# Patient Record
Sex: Female | Born: 1940
Health system: Southern US, Community
[De-identification: ages and names within clinical notes are randomized; demographics above are authoritative.]

## PROBLEM LIST (undated history)

## (undated) DIAGNOSIS — Z8719 Personal history of other diseases of the digestive system: Secondary | ICD-10-CM

## (undated) DIAGNOSIS — Z8679 Personal history of other diseases of the circulatory system: Secondary | ICD-10-CM

## (undated) DIAGNOSIS — Z9889 Other specified postprocedural states: Secondary | ICD-10-CM

## (undated) DIAGNOSIS — I10 Essential (primary) hypertension: Secondary | ICD-10-CM

## (undated) DIAGNOSIS — K219 Gastro-esophageal reflux disease without esophagitis: Secondary | ICD-10-CM

## (undated) DIAGNOSIS — K222 Esophageal obstruction: Secondary | ICD-10-CM

## (undated) HISTORY — PX: CHOLECYSTECTOMY: SHX55

## (undated) HISTORY — PX: BREAST LUMPECTOMY: SHX2

## (undated) HISTORY — PX: CATARACT EXTRACTION: SUR2

## (undated) HISTORY — PX: ESOPHAGEAL DILATION: SHX303

---

## 1973-02-19 HISTORY — PX: BACK SURGERY: SHX140

## 1997-06-07 ENCOUNTER — Other Ambulatory Visit: Admission: RE | Admit: 1997-06-07 | Discharge: 1997-06-07 | Payer: Self-pay | Admitting: Obstetrics & Gynecology

## 1998-10-03 ENCOUNTER — Other Ambulatory Visit: Admission: RE | Admit: 1998-10-03 | Discharge: 1998-10-03 | Payer: Self-pay | Admitting: Gynecology

## 1999-12-08 ENCOUNTER — Other Ambulatory Visit: Admission: RE | Admit: 1999-12-08 | Discharge: 1999-12-08 | Payer: Self-pay | Admitting: Gynecology

## 2001-01-10 ENCOUNTER — Other Ambulatory Visit: Admission: RE | Admit: 2001-01-10 | Discharge: 2001-01-10 | Payer: Self-pay | Admitting: Obstetrics and Gynecology

## 2002-02-16 ENCOUNTER — Other Ambulatory Visit: Admission: RE | Admit: 2002-02-16 | Discharge: 2002-02-16 | Payer: Self-pay | Admitting: Obstetrics and Gynecology

## 2003-02-18 ENCOUNTER — Other Ambulatory Visit: Admission: RE | Admit: 2003-02-18 | Discharge: 2003-02-18 | Payer: Self-pay | Admitting: Obstetrics and Gynecology

## 2004-02-25 ENCOUNTER — Other Ambulatory Visit: Admission: RE | Admit: 2004-02-25 | Discharge: 2004-02-25 | Payer: Self-pay | Admitting: Obstetrics and Gynecology

## 2009-08-26 ENCOUNTER — Encounter: Admission: RE | Admit: 2009-08-26 | Discharge: 2009-08-26 | Payer: Self-pay | Admitting: Obstetrics and Gynecology

## 2014-03-10 DIAGNOSIS — Z1231 Encounter for screening mammogram for malignant neoplasm of breast: Secondary | ICD-10-CM | POA: Diagnosis not present

## 2014-04-08 DIAGNOSIS — H01002 Unspecified blepharitis right lower eyelid: Secondary | ICD-10-CM | POA: Diagnosis not present

## 2014-04-08 DIAGNOSIS — H01004 Unspecified blepharitis left upper eyelid: Secondary | ICD-10-CM | POA: Diagnosis not present

## 2014-04-08 DIAGNOSIS — H04123 Dry eye syndrome of bilateral lacrimal glands: Secondary | ICD-10-CM | POA: Diagnosis not present

## 2014-04-08 DIAGNOSIS — H01005 Unspecified blepharitis left lower eyelid: Secondary | ICD-10-CM | POA: Diagnosis not present

## 2014-04-08 DIAGNOSIS — H01001 Unspecified blepharitis right upper eyelid: Secondary | ICD-10-CM | POA: Diagnosis not present

## 2014-04-21 DIAGNOSIS — Z01419 Encounter for gynecological examination (general) (routine) without abnormal findings: Secondary | ICD-10-CM | POA: Diagnosis not present

## 2014-05-10 DIAGNOSIS — M4726 Other spondylosis with radiculopathy, lumbar region: Secondary | ICD-10-CM | POA: Diagnosis not present

## 2014-05-10 DIAGNOSIS — M5416 Radiculopathy, lumbar region: Secondary | ICD-10-CM | POA: Diagnosis not present

## 2014-05-10 DIAGNOSIS — M961 Postlaminectomy syndrome, not elsewhere classified: Secondary | ICD-10-CM | POA: Diagnosis not present

## 2014-06-02 DIAGNOSIS — Z86010 Personal history of colonic polyps: Secondary | ICD-10-CM | POA: Diagnosis not present

## 2014-06-02 DIAGNOSIS — Z1211 Encounter for screening for malignant neoplasm of colon: Secondary | ICD-10-CM | POA: Diagnosis not present

## 2014-06-02 DIAGNOSIS — K5730 Diverticulosis of large intestine without perforation or abscess without bleeding: Secondary | ICD-10-CM | POA: Diagnosis not present

## 2014-06-30 DIAGNOSIS — Z1389 Encounter for screening for other disorder: Secondary | ICD-10-CM | POA: Diagnosis not present

## 2014-06-30 DIAGNOSIS — I10 Essential (primary) hypertension: Secondary | ICD-10-CM | POA: Diagnosis not present

## 2014-06-30 DIAGNOSIS — E2839 Other primary ovarian failure: Secondary | ICD-10-CM | POA: Diagnosis not present

## 2014-06-30 DIAGNOSIS — Z9181 History of falling: Secondary | ICD-10-CM | POA: Diagnosis not present

## 2014-06-30 DIAGNOSIS — R7301 Impaired fasting glucose: Secondary | ICD-10-CM | POA: Diagnosis not present

## 2014-06-30 DIAGNOSIS — Z6823 Body mass index (BMI) 23.0-23.9, adult: Secondary | ICD-10-CM | POA: Diagnosis not present

## 2014-08-13 DIAGNOSIS — M8589 Other specified disorders of bone density and structure, multiple sites: Secondary | ICD-10-CM | POA: Diagnosis not present

## 2014-08-16 DIAGNOSIS — H01002 Unspecified blepharitis right lower eyelid: Secondary | ICD-10-CM | POA: Diagnosis not present

## 2014-08-16 DIAGNOSIS — H01001 Unspecified blepharitis right upper eyelid: Secondary | ICD-10-CM | POA: Diagnosis not present

## 2014-08-16 DIAGNOSIS — H01005 Unspecified blepharitis left lower eyelid: Secondary | ICD-10-CM | POA: Diagnosis not present

## 2014-08-16 DIAGNOSIS — H01004 Unspecified blepharitis left upper eyelid: Secondary | ICD-10-CM | POA: Diagnosis not present

## 2014-08-26 DIAGNOSIS — M961 Postlaminectomy syndrome, not elsewhere classified: Secondary | ICD-10-CM | POA: Diagnosis not present

## 2014-08-26 DIAGNOSIS — M4726 Other spondylosis with radiculopathy, lumbar region: Secondary | ICD-10-CM | POA: Diagnosis not present

## 2014-08-26 DIAGNOSIS — M5416 Radiculopathy, lumbar region: Secondary | ICD-10-CM | POA: Diagnosis not present

## 2014-08-30 DIAGNOSIS — J029 Acute pharyngitis, unspecified: Secondary | ICD-10-CM | POA: Diagnosis not present

## 2014-08-30 DIAGNOSIS — G2581 Restless legs syndrome: Secondary | ICD-10-CM | POA: Diagnosis not present

## 2014-08-30 DIAGNOSIS — J020 Streptococcal pharyngitis: Secondary | ICD-10-CM | POA: Diagnosis not present

## 2014-08-30 DIAGNOSIS — R0982 Postnasal drip: Secondary | ICD-10-CM | POA: Diagnosis not present

## 2014-08-30 DIAGNOSIS — Z6823 Body mass index (BMI) 23.0-23.9, adult: Secondary | ICD-10-CM | POA: Diagnosis not present

## 2014-09-13 DIAGNOSIS — M5416 Radiculopathy, lumbar region: Secondary | ICD-10-CM | POA: Diagnosis not present

## 2014-09-13 DIAGNOSIS — M4726 Other spondylosis with radiculopathy, lumbar region: Secondary | ICD-10-CM | POA: Diagnosis not present

## 2014-09-13 DIAGNOSIS — M961 Postlaminectomy syndrome, not elsewhere classified: Secondary | ICD-10-CM | POA: Diagnosis not present

## 2014-09-30 DIAGNOSIS — M5416 Radiculopathy, lumbar region: Secondary | ICD-10-CM | POA: Diagnosis not present

## 2014-09-30 DIAGNOSIS — M961 Postlaminectomy syndrome, not elsewhere classified: Secondary | ICD-10-CM | POA: Diagnosis not present

## 2014-10-08 ENCOUNTER — Other Ambulatory Visit: Payer: Self-pay | Admitting: Physical Medicine and Rehabilitation

## 2014-10-08 DIAGNOSIS — M545 Low back pain: Secondary | ICD-10-CM

## 2014-10-08 DIAGNOSIS — H04123 Dry eye syndrome of bilateral lacrimal glands: Secondary | ICD-10-CM | POA: Diagnosis not present

## 2014-10-16 ENCOUNTER — Ambulatory Visit
Admission: RE | Admit: 2014-10-16 | Discharge: 2014-10-16 | Disposition: A | Discharge status: Home / Self Care | Payer: Commercial Managed Care - HMO | Source: Ambulatory Visit | Attending: Physical Medicine and Rehabilitation | Admitting: Physical Medicine and Rehabilitation

## 2014-10-16 DIAGNOSIS — M4806 Spinal stenosis, lumbar region: Secondary | ICD-10-CM | POA: Diagnosis not present

## 2014-10-16 DIAGNOSIS — M545 Low back pain: Secondary | ICD-10-CM

## 2014-10-16 DIAGNOSIS — M5126 Other intervertebral disc displacement, lumbar region: Secondary | ICD-10-CM | POA: Diagnosis not present

## 2014-10-21 DIAGNOSIS — M961 Postlaminectomy syndrome, not elsewhere classified: Secondary | ICD-10-CM | POA: Diagnosis not present

## 2014-11-04 DIAGNOSIS — M961 Postlaminectomy syndrome, not elsewhere classified: Secondary | ICD-10-CM | POA: Diagnosis not present

## 2014-11-04 DIAGNOSIS — M5416 Radiculopathy, lumbar region: Secondary | ICD-10-CM | POA: Diagnosis not present

## 2014-11-16 DIAGNOSIS — M961 Postlaminectomy syndrome, not elsewhere classified: Secondary | ICD-10-CM | POA: Diagnosis not present

## 2014-11-16 DIAGNOSIS — M5416 Radiculopathy, lumbar region: Secondary | ICD-10-CM | POA: Diagnosis not present

## 2014-11-16 DIAGNOSIS — M4726 Other spondylosis with radiculopathy, lumbar region: Secondary | ICD-10-CM | POA: Diagnosis not present

## 2014-11-20 DIAGNOSIS — N3090 Cystitis, unspecified without hematuria: Secondary | ICD-10-CM | POA: Diagnosis not present

## 2014-11-22 ENCOUNTER — Other Ambulatory Visit (HOSPITAL_COMMUNITY): Payer: Self-pay | Admitting: Orthopaedic Surgery

## 2014-12-02 ENCOUNTER — Encounter (HOSPITAL_COMMUNITY)
Admission: RE | Admit: 2014-12-02 | Discharge: 2014-12-02 | Disposition: A | Discharge status: Home / Self Care | Payer: Commercial Managed Care - HMO | Source: Ambulatory Visit | Attending: Orthopaedic Surgery | Admitting: Orthopaedic Surgery

## 2014-12-02 ENCOUNTER — Encounter (HOSPITAL_COMMUNITY): Payer: Self-pay

## 2014-12-02 ENCOUNTER — Ambulatory Visit (HOSPITAL_COMMUNITY)
Admission: RE | Admit: 2014-12-02 | Discharge: 2014-12-02 | Disposition: A | Discharge status: Home / Self Care | Payer: Commercial Managed Care - HMO | Source: Ambulatory Visit | Attending: Orthopaedic Surgery | Admitting: Orthopaedic Surgery

## 2014-12-02 DIAGNOSIS — K219 Gastro-esophageal reflux disease without esophagitis: Secondary | ICD-10-CM | POA: Diagnosis not present

## 2014-12-02 DIAGNOSIS — Z01812 Encounter for preprocedural laboratory examination: Secondary | ICD-10-CM | POA: Diagnosis not present

## 2014-12-02 DIAGNOSIS — R9431 Abnormal electrocardiogram [ECG] [EKG]: Secondary | ICD-10-CM | POA: Insufficient documentation

## 2014-12-02 DIAGNOSIS — I447 Left bundle-branch block, unspecified: Secondary | ICD-10-CM | POA: Insufficient documentation

## 2014-12-02 DIAGNOSIS — M4806 Spinal stenosis, lumbar region: Secondary | ICD-10-CM | POA: Diagnosis not present

## 2014-12-02 DIAGNOSIS — Z01818 Encounter for other preprocedural examination: Secondary | ICD-10-CM | POA: Diagnosis not present

## 2014-12-02 DIAGNOSIS — I10 Essential (primary) hypertension: Secondary | ICD-10-CM | POA: Insufficient documentation

## 2014-12-02 DIAGNOSIS — I517 Cardiomegaly: Secondary | ICD-10-CM | POA: Insufficient documentation

## 2014-12-02 DIAGNOSIS — M4800 Spinal stenosis, site unspecified: Secondary | ICD-10-CM | POA: Diagnosis not present

## 2014-12-02 DIAGNOSIS — M48061 Spinal stenosis, lumbar region without neurogenic claudication: Secondary | ICD-10-CM

## 2014-12-02 HISTORY — DX: Other specified postprocedural states: Z98.890

## 2014-12-02 HISTORY — DX: Gastro-esophageal reflux disease without esophagitis: K21.9

## 2014-12-02 HISTORY — DX: Essential (primary) hypertension: I10

## 2014-12-02 HISTORY — DX: Personal history of other diseases of the circulatory system: Z86.79

## 2014-12-02 HISTORY — DX: Esophageal obstruction: K22.2

## 2014-12-02 HISTORY — DX: Personal history of other diseases of the digestive system: Z87.19

## 2014-12-02 LAB — COMPREHENSIVE METABOLIC PANEL
ALBUMIN: 4 g/dL (ref 3.5–5.0)
ALK PHOS: 46 U/L (ref 38–126)
ALT: 15 U/L (ref 14–54)
AST: 20 U/L (ref 15–41)
Anion gap: 7 (ref 5–15)
BUN: 9 mg/dL (ref 6–20)
CALCIUM: 9.4 mg/dL (ref 8.9–10.3)
CHLORIDE: 99 mmol/L — AB (ref 101–111)
CO2: 29 mmol/L (ref 22–32)
CREATININE: 0.83 mg/dL (ref 0.44–1.00)
GFR calc Af Amer: >60 mL/min (ref >60)
GFR calc non Af Amer: >60 mL/min (ref >60)
GLUCOSE: 104 mg/dL — AB (ref 65–99)
Potassium: 3.8 mmol/L (ref 3.5–5.1)
SODIUM: 135 mmol/L (ref 135–145)
Total Bilirubin: 0.9 mg/dL (ref 0.3–1.2)
Total Protein: 6.5 g/dL (ref 6.5–8.1)

## 2014-12-02 LAB — CBC
HCT: 36.8 % (ref 36.0–46.0)
HEMOGLOBIN: 12.4 g/dL (ref 12.0–15.0)
MCH: 29.6 pg (ref 26.0–34.0)
MCHC: 33.7 g/dL (ref 30.0–36.0)
MCV: 87.8 fL (ref 78.0–100.0)
PLATELETS: 312 10*3/uL (ref 150–400)
RBC: 4.19 MIL/uL (ref 3.87–5.11)
RDW: 13.8 % (ref 11.5–15.5)
WBC: 6.8 10*3/uL (ref 4.0–10.5)

## 2014-12-02 LAB — PROTIME-INR
INR: 1.08 (ref 0.00–1.49)
PROTHROMBIN TIME: 14.2 s (ref 11.6–15.2)

## 2014-12-02 LAB — SURGICAL PCR SCREEN
MRSA, PCR: NEGATIVE
STAPHYLOCOCCUS AUREUS: NEGATIVE

## 2014-12-02 MED ORDER — CEFAZOLIN SODIUM-DEXTROSE 2-3 GM-% IV SOLR
2.0000 g | INTRAVENOUS | Status: DC
  Filled 2014-12-02: qty 50

## 2014-12-02 MED ORDER — CHLORHEXIDINE GLUCONATE 4 % EX LIQD: 60.0000 mL | Freq: Once | CUTANEOUS | Status: DC

## 2014-12-02 NOTE — Pre-Procedure Instructions (Signed)
Laura Romero  12/02/2014      Your procedure is scheduled on October 14.  Report to [facility] Admitting at 5:30 A.M.  Call this number if you have problems the morning of surgery:  [phone]   Remember:  Do not eat food or drink liquids after midnight.  Take these medicines the morning of surgery with A SIP OF WATER Atenolol- Chlorthalidone, Omeprazole,   STOP/ Do not take Aspirin, Aleve, Naproxen, Advil, Ibuprofen, Motrin, Vitamins, Herbs, or Supplements starting today   Do not wear jewelry, make-up or nail polish.  Do not wear lotions, powders, or perfumes.  You may wear deodorant.  Do not shave 48 hours prior to surgery.  Men may shave face and neck.  Do not bring valuables to the hospital.  [facility] is not responsible for any belongings or valuables.  Contacts, dentures or bridgework may not be worn into surgery.  Leave your suitcase in the car.  After surgery it may be brought to your room.  For patients admitted to the hospital, discharge time will be determined by your treatment team.  Patients discharged the day of surgery will not be allowed to drive home.   [name] - Preparing for Surgery  Before surgery, you can play an important role.  Because skin is not sterile, your skin needs to be as free of germs as possible.  You can reduce the number of germs on you skin by washing with CHG (chlorahexidine gluconate) soap before surgery.  CHG is an antiseptic cleaner which kills germs and bonds with the skin to continue killing germs even after washing.  Please DO NOT use if you have an allergy to CHG or antibacterial soaps.  If your skin becomes reddened/irritated stop using the CHG and inform your nurse when you arrive at Short Stay.  Do not shave (including legs and underarms) for at least 48 hours prior to the first CHG shower.  You may shave your face.  Please follow these instructions carefully:   1.  Shower with CHG Soap the night before  surgery and the morning of Surgery.  2.  If you choose to wash your hair, wash your hair first as usual with your normal shampoo.  3.  After you shampoo, rinse your hair and body thoroughly to remove the shampoo.  4.  Use CHG as you would any other liquid soap.  You can apply CHG directly to the skin and wash gently with scrungie or a clean washcloth.  5.  Apply the CHG Soap to your body ONLY FROM THE NECK DOWN.  Do not use on open wounds or open sores.  Avoid contact with your eyes, ears, mouth and genitals (private parts).  Wash genitals (private parts) with your normal soap.  6.  Wash thoroughly, paying special attention to the area where your surgery will be performed.  7.  Thoroughly rinse your body with warm water from the neck down.  8.  DO NOT shower/wash with your normal soap after using and rinsing off the CHG Soap.  9.  Pat yourself dry with a clean towel.            10.  Wear clean pajamas.            11.  Place clean sheets on your bed the night of your first shower and do not sleep with pets.  Day of Surgery  Do not apply any lotions the morning of surgery.  Please  wear clean clothes to the hospital/surgery center.   Please read over the following fact sheets that you were given. Pain Booklet, Coughing and Deep Breathing and Surgical Site Infection Prevention

## 2014-12-02 NOTE — Progress Notes (Signed)
Anesthesia Chart Review: Patient is a 74 year old female scheduled for left L4-5 lateral recess decompression, microdiscectomy (left) tomorrow (first case) by Dr. Lorin Mercy.  History includes left BBB, non-smoker, HTN, GERD, esophageal dilation, back surgery '75, cholecystectomy.  PCP is Dr. Nelda Bucks at [facility]. PAT RN reports patient was only a fair historian, but said she had known left BBB and a prior stress test ~ 5 years ago at [facility] No stress test received for from her PCP office, although his note states she does have a known LBBB history.    Meds include Fosamax, ASA 81 mg, Tenoretic, Folvite, Prilosec, tramadol.  12/02/14 EKG: SB with first degree AVB, possible LAE, LAD, left BBB. Old EKG received from her PCP which shows LAD and left BBB present since at least 12/23/13 with handwritten remarks stating, "No change since 09/21/10."  12/02/14 CXR: IMPRESSION: No acute pulmonary disease. Mild cardiomegaly.  Preoperative labs noted.   Above reviewed with anesthesiologist Dr. Jenita Seashore. Patient with known LBBB since at least 2012. If no acute changes then it is anticipated that she can proceed as planned.  George Hugh [facility] Short Stay Center/Anesthesiology Phone [phone] 12/02/2014 3:38 PM

## 2014-12-02 NOTE — Progress Notes (Signed)
Patient reports that she was having trouble recalling her complete medical history. Patient denies chest pain, shortness of breath, cardiology visit. Reports stress test ~ 5 years ago at [facility] due to fast heart rate. Requested EKG and last OV from PCP from Dr. Delena Bali.

## 2014-12-02 NOTE — Progress Notes (Signed)
Spoke to medical records at [facility] and they will fax over requested records.

## 2014-12-02 NOTE — H&P (Signed)
Laura Romero is an 74 y.o. female.   Patient returns for persistent problems with back pain, left leg pain, left leg and dorsal foot numbness, her radicular symptoms.  She has had 8 epidurals over the past 2 years for the same problem.  Patient is followed by Dr. Ilda Romero.     CURRENT MEDICATIONS:  She has been on atenolol, baby aspirin a day, folic acid, and omeprazole.     ALLERGIES  She has no known allergies.     PAST MEDICAL/SURGICAL HISTORY:  Other surgeries only include the back surgery.  She had one child with normal childbirth.     FAMILY HISTORY:  Positive for breast cancer, hypertension.     SOCIAL HISTORY:  She is here with her husband Laura Romero.  Patient is retired.  Does not smoke or drink.     REVIEW OF SYSTEMS:  Positive for hypertension, some teeth and gum disease, previous lumbar surgery by Dr. Verdene Romero in 1975.  Patients last injection which was 11/04/2014 gave her some relief for 3 days and then she has gotten gradual recurrence of her symptoms as before.  First injection of the 8 a couple of years ago lasted several months and gradually her symptoms are getting more severe, more painful with recurrence of left leg symptoms sooner after the injection.       Past Medical History  Diagnosis Date  . Hypertension   . GERD (gastroesophageal reflux disease)   . Esophageal stricture   . Status post dilatation of esophageal stricture   . History of left bundle branch block (LBBB)     Past Surgical History  Procedure Laterality Date  . Back surgery  1975    lower back surgery  . Esophageal dilation    . Breast lumpectomy Left   . Cataract extraction Bilateral   . Cholecystectomy      No family history on file. Social History:  reports that she has never smoked. She has never used smokeless tobacco. She reports that she does not drink alcohol or use illicit drugs.  Allergies: No Known Allergies  No prescriptions prior to admission    Results for orders  placed or performed during the hospital encounter of 12/02/14 (from the past 48 hour(s))  Surgical pcr screen     Status: None   Collection Time: 12/02/14 11:47 AM  Result Value Ref Range   MRSA, PCR NEGATIVE NEGATIVE   Staphylococcus aureus NEGATIVE NEGATIVE    Comment:        The Xpert SA Assay (FDA approved for NASAL specimens in patients over 49 years of age), is one component of a comprehensive surveillance program.  Test performance has been validated by [facility] for patients greater than or equal to 73 year old. It is not intended to diagnose infection nor to guide or monitor treatment.   CBC     Status: None   Collection Time: 12/02/14 11:48 AM  Result Value Ref Range   WBC 6.8 4.0 - 10.5 K/uL   RBC 4.19 3.87 - 5.11 MIL/uL   Hemoglobin 12.4 12.0 - 15.0 g/dL   HCT 36.8 36.0 - 46.0 %   MCV 87.8 78.0 - 100.0 fL   MCH 29.6 26.0 - 34.0 pg   MCHC 33.7 30.0 - 36.0 g/dL   RDW 13.8 11.5 - 15.5 %   Platelets 312 150 - 400 K/uL  Comprehensive metabolic panel     Status: Abnormal   Collection Time: 12/02/14 11:48 AM  Result  Value Ref Range   Sodium 135 135 - 145 mmol/L   Potassium 3.8 3.5 - 5.1 mmol/L   Chloride 99 (L) 101 - 111 mmol/L   CO2 29 22 - 32 mmol/L   Glucose, Bld 104 (H) 65 - 99 mg/dL   BUN 9 6 - 20 mg/dL   Creatinine, Ser 0.83 0.44 - 1.00 mg/dL   Calcium 9.4 8.9 - 10.3 mg/dL   Total Protein 6.5 6.5 - 8.1 g/dL   Albumin 4.0 3.5 - 5.0 g/dL   AST 20 15 - 41 U/L   ALT 15 14 - 54 U/L   Alkaline Phosphatase 46 38 - 126 U/L   Total Bilirubin 0.9 0.3 - 1.2 mg/dL   GFR calc non Af Amer >60 >60 mL/min   GFR calc Af Amer >60 >60 mL/min    Comment: (NOTE) The eGFR has been calculated using the CKD EPI equation. This calculation has not been validated in all clinical situations. eGFR's persistently <60 mL/min signify possible Chronic Kidney Disease.    Anion gap 7 5 - 15  Protime-INR     Status: None   Collection Time: 12/02/14 11:48 AM  Result Value Ref Range    Prothrombin Time 14.2 11.6 - 15.2 seconds   INR 1.08 0.00 - 1.49   Dg Chest 2 View  12/02/2014  CLINICAL DATA:  Spinal stenosis. EXAM: CHEST  2 VIEW COMPARISON:  01/21/2012 . FINDINGS: Mediastinum hilar structures normal. Cardiomegaly. Stable nodular apical pleural parenchymal thickening noted consistent with scarring. No pleural effusion or pneumothorax. No acute bony abnormality . IMPRESSION: No acute pulmonary disease.  Mild cardiomegaly. Electronically Signed   By: Laura Romero  Register   On: 12/02/2014 12:52    Review of Systems  Constitutional: Negative.   HENT: Negative.   Respiratory: Negative.   Cardiovascular: Negative.   Gastrointestinal: Negative.   Genitourinary: Negative.   Musculoskeletal: Positive for back pain.  Skin: Negative.   Psychiatric/Behavioral: Negative.     There were no vitals taken for this visit. Physical Exam  Constitutional: She is oriented to person, place, and time. No distress.  HENT:  Head: Atraumatic.  Eyes: EOM are normal.  Neck: Normal range of motion.  Respiratory: No respiratory distress.  GI: She exhibits no distension.  Musculoskeletal: She exhibits tenderness.  Neurological: She is alert and oriented to person, place, and time.  Skin: Skin is warm and dry.  Psychiatric: She has a normal mood and affect.    PHYSICAL EXAMINATION:  Patient is alert and oriented.  Height:  5 feet 6 inches.  Weight:  143 pounds.  Extraocular movements intact.  No audible wheezing.  Pulse rate is 62 and regular.  No abdominal tenderness.  Pedal pulses are 2+.  Quad strength is good.  Has 1/2 grade anterior tib weakness on the left, negative footdrop gait.  Trace EHL weakness.  Gastrocsoleus is strong.     RADIOGRAPHS:  MRI scan shows lateral recess stenosis with disk protrusion on the left at the L4-5 level.  Dr. Laverta Romero in 1975 did previous surgery on the right at the L4-5 level, which showed no evidence of recurrence.  She has bilateral facet hypertrophy, some  mild central stenosis, but primary problem is left paracentral disk protrusion causing lateral recess stenosis on the left.  She incidentally has a mild lateral protrusion on the right at L3-4, which is not symptomatic.     ASSESSMENT:  Disk protrusion L4-5.     PLAN:  We discussed options.  Patient would  like to proceed with surgical treatment of the disk protrusion, lateral recess decompression.  That would be overnight stay, use of the operative microscopy, using a small portion of her former incision.  Patient's procedure was discussed.  Risk of dural tear, spinal fluid leakage, reoperation, progression of the disk over a number of years, possible need for fusion, possible problems with heart trouble, etc.  She has been active, healthy other than this one specific problem.  Procedure discussed, risks discussed, all questions answered.  She understands and requests to proceed.   OWENS,JAMES M 12/02/2014, 4:35 PM

## 2014-12-03 ENCOUNTER — Encounter (HOSPITAL_COMMUNITY)
Admission: RE | Disposition: A | Discharge status: Home / Self Care | Payer: Self-pay | Source: Ambulatory Visit | Attending: Orthopaedic Surgery

## 2014-12-03 ENCOUNTER — Ambulatory Visit (HOSPITAL_COMMUNITY): Payer: Commercial Managed Care - HMO | Admitting: Vascular Surgery

## 2014-12-03 ENCOUNTER — Ambulatory Visit (HOSPITAL_COMMUNITY): Payer: Commercial Managed Care - HMO

## 2014-12-03 ENCOUNTER — Encounter (HOSPITAL_COMMUNITY): Payer: Self-pay

## 2014-12-03 ENCOUNTER — Ambulatory Visit (HOSPITAL_COMMUNITY): Payer: Commercial Managed Care - HMO | Admitting: Anesthesiology

## 2014-12-03 ENCOUNTER — Observation Stay (HOSPITAL_COMMUNITY)
Admission: RE | Admit: 2014-12-03 | Discharge: 2014-12-04 | Disposition: A | Discharge status: Home / Self Care | Payer: Commercial Managed Care - HMO | Source: Ambulatory Visit | Attending: Orthopaedic Surgery | Admitting: Orthopaedic Surgery

## 2014-12-03 DIAGNOSIS — Z79899 Other long term (current) drug therapy: Secondary | ICD-10-CM | POA: Diagnosis not present

## 2014-12-03 DIAGNOSIS — M5126 Other intervertebral disc displacement, lumbar region: Secondary | ICD-10-CM | POA: Diagnosis not present

## 2014-12-03 DIAGNOSIS — Z419 Encounter for procedure for purposes other than remedying health state, unspecified: Secondary | ICD-10-CM

## 2014-12-03 DIAGNOSIS — M4806 Spinal stenosis, lumbar region: Secondary | ICD-10-CM | POA: Diagnosis not present

## 2014-12-03 DIAGNOSIS — Z9889 Other specified postprocedural states: Secondary | ICD-10-CM | POA: Diagnosis not present

## 2014-12-03 DIAGNOSIS — I10 Essential (primary) hypertension: Secondary | ICD-10-CM | POA: Insufficient documentation

## 2014-12-03 HISTORY — PX: LUMBAR LAMINECTOMY/DECOMPRESSION MICRODISCECTOMY: SHX5026

## 2014-12-03 SURGERY — LUMBAR LAMINECTOMY/DECOMPRESSION MICRODISCECTOMY
Anesthesia: General | Site: Back | Laterality: Left

## 2014-12-03 MED ORDER — CHLORTHALIDONE 25 MG PO TABS
25.0000 mg | ORAL_TABLET | Freq: Every day | ORAL | Status: DC
Start: 2014-12-04 — End: 2014-12-04
  Administered 2014-12-04: 25 mg via ORAL
  Filled 2014-12-03: qty 1

## 2014-12-03 MED ORDER — ROCURONIUM BROMIDE 100 MG/10ML IV SOLN
INTRAVENOUS | Status: DC | PRN
  Administered 2014-12-03: 35 mg via INTRAVENOUS

## 2014-12-03 MED ORDER — LACTATED RINGERS IV SOLN
INTRAVENOUS | Status: DC | PRN
  Administered 2014-12-03 (×2): via INTRAVENOUS

## 2014-12-03 MED ORDER — ATENOLOL-CHLORTHALIDONE 50-25 MG PO TABS: 1.0000 | ORAL_TABLET | Freq: Every day | ORAL | Status: DC

## 2014-12-03 MED ORDER — HYDROMORPHONE HCL 1 MG/ML IJ SOLN
INTRAMUSCULAR | Status: AC
  Filled 2014-12-03: qty 1

## 2014-12-03 MED ORDER — METHOCARBAMOL 500 MG PO TABS: 500.0000 mg | ORAL_TABLET | Freq: Four times a day (QID) | ORAL | Status: DC

## 2014-12-03 MED ORDER — FENTANYL CITRATE (PF) 250 MCG/5ML IJ SOLN
INTRAMUSCULAR | Status: AC
  Filled 2014-12-03: qty 5

## 2014-12-03 MED ORDER — MENTHOL 3 MG MT LOZG
1.0000 | LOZENGE | OROMUCOSAL | Status: DC | PRN
  Filled 2014-12-03: qty 9

## 2014-12-03 MED ORDER — ONDANSETRON HCL 4 MG/2ML IJ SOLN
INTRAMUSCULAR | Status: DC | PRN
  Administered 2014-12-03: 4 mg via INTRAVENOUS

## 2014-12-03 MED ORDER — FOLIC ACID 1 MG PO TABS
1.0000 mg | ORAL_TABLET | Freq: Every day | ORAL | Status: DC
  Administered 2014-12-03 – 2014-12-04 (×2): 1 mg via ORAL
  Filled 2014-12-03 (×2): qty 1

## 2014-12-03 MED ORDER — LIDOCAINE HCL (CARDIAC) 20 MG/ML IV SOLN
INTRAVENOUS | Status: DC | PRN
  Administered 2014-12-03: 60 mg via INTRAVENOUS

## 2014-12-03 MED ORDER — METHOCARBAMOL 500 MG PO TABS: 500.0000 mg | ORAL_TABLET | Freq: Four times a day (QID) | ORAL | Status: DC | PRN

## 2014-12-03 MED ORDER — POLYETHYLENE GLYCOL 3350 17 G PO PACK: 17.0000 g | PACK | Freq: Every day | ORAL | Status: DC | PRN

## 2014-12-03 MED ORDER — PROPOFOL 10 MG/ML IV BOLUS
INTRAVENOUS | Status: AC
  Filled 2014-12-03: qty 20

## 2014-12-03 MED ORDER — PROPOFOL 10 MG/ML IV BOLUS
INTRAVENOUS | Status: DC | PRN
  Administered 2014-12-03: 100 mg via INTRAVENOUS

## 2014-12-03 MED ORDER — 0.9 % SODIUM CHLORIDE (POUR BTL) OPTIME
TOPICAL | Status: DC | PRN
  Administered 2014-12-03: 1000 mL

## 2014-12-03 MED ORDER — ACETAMINOPHEN 325 MG PO TABS: 650.0000 mg | ORAL_TABLET | ORAL | Status: DC | PRN

## 2014-12-03 MED ORDER — FOLIC ACID 800 MCG PO TABS: 800.0000 ug | ORAL_TABLET | Freq: Every day | ORAL | Status: DC

## 2014-12-03 MED ORDER — ONDANSETRON HCL 4 MG/2ML IJ SOLN: 4.0000 mg | INTRAMUSCULAR | Status: DC | PRN

## 2014-12-03 MED ORDER — NEOSTIGMINE METHYLSULFATE 10 MG/10ML IV SOLN
INTRAVENOUS | Status: DC | PRN
Start: 2014-12-03 — End: 2014-12-03
  Administered 2014-12-03: 3 mg via INTRAVENOUS

## 2014-12-03 MED ORDER — SODIUM CHLORIDE 0.9 % IJ SOLN: 3.0000 mL | INTRAMUSCULAR | Status: DC | PRN

## 2014-12-03 MED ORDER — ONDANSETRON HCL 4 MG/2ML IJ SOLN: 4.0000 mg | Freq: Once | INTRAMUSCULAR | Status: DC | PRN

## 2014-12-03 MED ORDER — DEXTROSE 5 % IV SOLN
500.0000 mg | Freq: Four times a day (QID) | INTRAVENOUS | Status: DC | PRN
  Filled 2014-12-03: qty 5

## 2014-12-03 MED ORDER — PHENYLEPHRINE HCL 10 MG/ML IJ SOLN
INTRAMUSCULAR | Status: DC | PRN
  Administered 2014-12-03 (×2): 80 ug via INTRAVENOUS
  Administered 2014-12-03 (×2): 40 ug via INTRAVENOUS

## 2014-12-03 MED ORDER — OXYCODONE-ACETAMINOPHEN 5-325 MG PO TABS: 1.0000 | ORAL_TABLET | Freq: Four times a day (QID) | ORAL | Status: AC | PRN

## 2014-12-03 MED ORDER — OXYCODONE-ACETAMINOPHEN 5-325 MG PO TABS
1.0000 | ORAL_TABLET | ORAL | Status: DC | PRN
  Administered 2014-12-03 – 2014-12-04 (×4): 2 via ORAL
  Filled 2014-12-03 (×4): qty 2

## 2014-12-03 MED ORDER — MIDAZOLAM HCL 2 MG/2ML IJ SOLN
INTRAMUSCULAR | Status: AC
  Filled 2014-12-03: qty 4

## 2014-12-03 MED ORDER — CEFAZOLIN SODIUM 1-5 GM-% IV SOLN
1.0000 g | Freq: Three times a day (TID) | INTRAVENOUS | Status: AC
  Administered 2014-12-03 (×2): 1 g via INTRAVENOUS
  Filled 2014-12-03 (×2): qty 50

## 2014-12-03 MED ORDER — MEPERIDINE HCL 25 MG/ML IJ SOLN: 6.2500 mg | INTRAMUSCULAR | Status: DC | PRN

## 2014-12-03 MED ORDER — POTASSIUM CHLORIDE IN NACL 20-0.45 MEQ/L-% IV SOLN
INTRAVENOUS | Status: DC
  Administered 2014-12-03: 14:00:00 via INTRAVENOUS
  Filled 2014-12-03 (×4): qty 1000

## 2014-12-03 MED ORDER — DEXAMETHASONE SODIUM PHOSPHATE 4 MG/ML IJ SOLN
INTRAMUSCULAR | Status: DC | PRN
  Administered 2014-12-03: 4 mg via INTRAVENOUS

## 2014-12-03 MED ORDER — HYDROMORPHONE HCL 1 MG/ML IJ SOLN: 0.5000 mg | INTRAMUSCULAR | Status: DC | PRN

## 2014-12-03 MED ORDER — SODIUM CHLORIDE 0.9 % IV SOLN: 250.0000 mL | INTRAVENOUS | Status: DC

## 2014-12-03 MED ORDER — PANTOPRAZOLE SODIUM 40 MG PO TBEC
40.0000 mg | DELAYED_RELEASE_TABLET | Freq: Every day | ORAL | Status: DC
  Administered 2014-12-04: 40 mg via ORAL
  Filled 2014-12-03: qty 1

## 2014-12-03 MED ORDER — ATENOLOL 50 MG PO TABS
50.0000 mg | ORAL_TABLET | Freq: Every day | ORAL | Status: DC
  Administered 2014-12-04: 50 mg via ORAL
  Filled 2014-12-03: qty 1

## 2014-12-03 MED ORDER — BUPIVACAINE HCL (PF) 0.25 % IJ SOLN
INTRAMUSCULAR | Status: DC | PRN
  Administered 2014-12-03: 10 mL

## 2014-12-03 MED ORDER — SODIUM CHLORIDE 0.9 % IJ SOLN
3.0000 mL | Freq: Two times a day (BID) | INTRAMUSCULAR | Status: DC
  Administered 2014-12-03 – 2014-12-04 (×2): 3 mL via INTRAVENOUS

## 2014-12-03 MED ORDER — HYDROMORPHONE HCL 1 MG/ML IJ SOLN
0.2500 mg | INTRAMUSCULAR | Status: DC | PRN
  Administered 2014-12-03 (×4): 0.25 mg via INTRAVENOUS

## 2014-12-03 MED ORDER — FENTANYL CITRATE (PF) 100 MCG/2ML IJ SOLN
INTRAMUSCULAR | Status: DC | PRN
  Administered 2014-12-03: 100 ug via INTRAVENOUS
  Administered 2014-12-03: 50 ug via INTRAVENOUS

## 2014-12-03 MED ORDER — BUPIVACAINE HCL (PF) 0.25 % IJ SOLN
INTRAMUSCULAR | Status: AC
  Filled 2014-12-03: qty 30

## 2014-12-03 MED ORDER — THROMBIN 20000 UNITS EX SOLR
CUTANEOUS | Status: AC
  Filled 2014-12-03: qty 20000

## 2014-12-03 MED ORDER — ARTIFICIAL TEARS OP OINT
TOPICAL_OINTMENT | OPHTHALMIC | Status: DC | PRN
  Administered 2014-12-03: 1 via OPHTHALMIC

## 2014-12-03 MED ORDER — GLYCOPYRROLATE 0.2 MG/ML IJ SOLN
INTRAMUSCULAR | Status: DC | PRN
  Administered 2014-12-03: .4 mg via INTRAVENOUS
  Administered 2014-12-03: 0.2 mg via INTRAVENOUS

## 2014-12-03 MED ORDER — DOCUSATE SODIUM 100 MG PO CAPS
100.0000 mg | ORAL_CAPSULE | Freq: Two times a day (BID) | ORAL | Status: DC
  Administered 2014-12-03 – 2014-12-04 (×3): 100 mg via ORAL
  Filled 2014-12-03 (×3): qty 1

## 2014-12-03 MED ORDER — ACETAMINOPHEN 650 MG RE SUPP: 650.0000 mg | RECTAL | Status: DC | PRN

## 2014-12-03 MED ORDER — PHENOL 1.4 % MT LIQD: 1.0000 | OROMUCOSAL | Status: DC | PRN

## 2014-12-03 SURGICAL SUPPLY — 47 items
ADH SKN CLS APL DERMABOND .7 (GAUZE/BANDAGES/DRESSINGS) ×1
BUR ROUND FLUTED 4 SOFT TCH (BURR) ×1 IMPLANT
BUR ROUND FLUTED 4MM SOFT TCH (BURR) ×1
CANISTER SUCT 3000ML PPV (MISCELLANEOUS) ×2 IMPLANT
CLOSURE STERI-STRIP 1/2X4 (GAUZE/BANDAGES/DRESSINGS) ×1
CLSR STERI-STRIP ANTIMIC 1/2X4 (GAUZE/BANDAGES/DRESSINGS) ×2 IMPLANT
COVER SURGICAL LIGHT HANDLE (MISCELLANEOUS) ×3 IMPLANT
DERMABOND ADVANCED (GAUZE/BANDAGES/DRESSINGS) ×2
DERMABOND ADVANCED .7 DNX12 (GAUZE/BANDAGES/DRESSINGS) ×1 IMPLANT
DRAPE MICROSCOPE LEICA (MISCELLANEOUS) ×3 IMPLANT
DRAPE PROXIMA HALF (DRAPES) ×6 IMPLANT
DRSG MEPILEX BORDER 4X4 (GAUZE/BANDAGES/DRESSINGS) ×1 IMPLANT
DRSG MEPILEX BORDER 4X8 (GAUZE/BANDAGES/DRESSINGS) ×3 IMPLANT
DURAPREP 26ML APPLICATOR (WOUND CARE) ×3 IMPLANT
ELECT REM PT RETURN 9FT ADLT (ELECTROSURGICAL) ×3
ELECTRODE REM PT RTRN 9FT ADLT (ELECTROSURGICAL) ×1 IMPLANT
GLOVE BIOGEL PI IND STRL 8 (GLOVE) ×2 IMPLANT
GLOVE BIOGEL PI INDICATOR 8 (GLOVE) ×4
GLOVE ORTHO TXT STRL SZ7.5 (GLOVE) ×6 IMPLANT
GOWN STRL REUS W/ TWL LRG LVL3 (GOWN DISPOSABLE) ×2 IMPLANT
GOWN STRL REUS W/ TWL XL LVL3 (GOWN DISPOSABLE) ×1 IMPLANT
GOWN STRL REUS W/TWL 2XL LVL3 (GOWN DISPOSABLE) ×3 IMPLANT
GOWN STRL REUS W/TWL LRG LVL3 (GOWN DISPOSABLE) ×6
GOWN STRL REUS W/TWL XL LVL3 (GOWN DISPOSABLE) ×3
IV CATH 14GX2 1/4 (CATHETERS) ×2 IMPLANT
KIT BASIN OR (CUSTOM PROCEDURE TRAY) ×3 IMPLANT
KIT ROOM TURNOVER OR (KITS) ×3 IMPLANT
MANIFOLD NEPTUNE II (INSTRUMENTS) IMPLANT
NDL HYPO 25GX1X1/2 BEV (NEEDLE) ×1 IMPLANT
NEEDLE HYPO 25GX1X1/2 BEV (NEEDLE) ×3 IMPLANT
NEEDLE SPNL 18GX3.5 QUINCKE PK (NEEDLE) ×3 IMPLANT
NS IRRIG 1000ML POUR BTL (IV SOLUTION) ×3 IMPLANT
PACK LAMINECTOMY ORTHO (CUSTOM PROCEDURE TRAY) ×3 IMPLANT
PAD ARMBOARD 7.5X6 YLW CONV (MISCELLANEOUS) ×6 IMPLANT
PATTIES SURGICAL .5 X.5 (GAUZE/BANDAGES/DRESSINGS) IMPLANT
PATTIES SURGICAL .75X.75 (GAUZE/BANDAGES/DRESSINGS) ×3 IMPLANT
SUT BONE WAX W31G (SUTURE) ×2 IMPLANT
SUT VIC AB 0 CT1 27 (SUTURE) ×3
SUT VIC AB 0 CT1 27XBRD ANBCTR (SUTURE) ×1 IMPLANT
SUT VIC AB 1 CTX 36 (SUTURE) ×3
SUT VIC AB 1 CTX36XBRD ANBCTR (SUTURE) IMPLANT
SUT VIC AB 2-0 CT1 27 (SUTURE) ×3
SUT VIC AB 2-0 CT1 TAPERPNT 27 (SUTURE) ×1 IMPLANT
SUT VIC AB 3-0 X1 27 (SUTURE) IMPLANT
TOWEL OR 17X24 6PK STRL BLUE (TOWEL DISPOSABLE) ×3 IMPLANT
TOWEL OR 17X26 10 PK STRL BLUE (TOWEL DISPOSABLE) ×3 IMPLANT
WATER STERILE IRR 1000ML POUR (IV SOLUTION) ×1 IMPLANT

## 2014-12-03 NOTE — Anesthesia Procedure Notes (Signed)
Procedure Name: Intubation Date/Time: 12/03/2014 7:44 AM Performed by: Mariea Clonts Pre-anesthesia Checklist: Emergency Drugs available, Patient identified, Timeout performed, Suction available and Patient being monitored Patient Re-evaluated:Patient Re-evaluated prior to inductionOxygen Delivery Method: Circle system utilized Preoxygenation: Pre-oxygenation with 100% oxygen Intubation Type: IV induction Ventilation: Mask ventilation without difficulty and Oral airway inserted - appropriate to patient size Laryngoscope Size: Sabra Heck and 2 Grade View: Grade III Tube type: Oral Tube size: 7.5 mm Number of attempts: 2 Airway Equipment and Method: Oral airway and Bougie stylet Placement Confirmation: ETT inserted through vocal cords under direct vision,  positive ETCO2 and breath sounds checked- equal and bilateral Tube secured with: Tape Dental Injury: Teeth and Oropharynx as per pre-operative assessment

## 2014-12-03 NOTE — Transfer of Care (Signed)
Immediate Anesthesia Transfer of Care Note  Patient: Laura Romero  Procedure(s) Performed: Procedure(s): Left Re-exploration L4-5 Lateral Recess Decompression, Microdiscectomy (Left)  Patient Location: PACU  Anesthesia Type:General  Level of Consciousness: awake, alert  and oriented  Airway & Oxygen Therapy: Patient Spontanous Breathing and Patient connected to nasal cannula oxygen  Post-op Assessment: Report given to RN and Post -op Vital signs reviewed and stable  Post vital signs: Reviewed and stable  Last Vitals:  Filed Vitals:   12/03/14 0619  BP: 192/51  Pulse:   Temp:   Resp:     Complications: No apparent anesthesia complications

## 2014-12-03 NOTE — Brief Op Note (Signed)
12/03/2014  9:36 AM  PATIENT:  Laura Romero  74 y.o. female  PRE-OPERATIVE DIAGNOSIS:  Left L4-5 Disc Protrusion, Lateral Recess Stenosis  POST-OPERATIVE DIAGNOSIS:  Left L4-5 Disc Protrusion, Lateral Recess Stenosis  PROCEDURE:  Procedure(s): Left Re-exploration L4-5 Lateral Recess Decompression, Microdiscectomy (Left)  SURGEON:  Surgeon(s) and Role:    * Marybelle Killings, MD - Primary  PHYSICIAN ASSISTANT: Benjiman Core pa-c     ANESTHESIA:   general  EBL:  Total I/O In: 1000 [I.V.:1000] Out: -   BLOOD ADMINISTERED:none  DRAINS: none   LOCAL MEDICATIONS USED:  MARCAINE     SPECIMEN:  No Specimen  DISPOSITION OF SPECIMEN:  N/A  COUNTS:  YES  TOURNIQUET:  * No tourniquets in log *  DICTATION: .Dragon Dictation  PLAN OF CARE: Admit to inpatient   PATIENT DISPOSITION:  PACU - hemodynamically stable.

## 2014-12-03 NOTE — Anesthesia Postprocedure Evaluation (Signed)
Anesthesia Post Note  Patient: Laura Romero  Procedure(s) Performed: Procedure(s) (LRB): Left Re-exploration L4-5 Lateral Recess Decompression, Microdiscectomy (Left)  Anesthesia type: general  Patient location: PACU  Post pain: Pain level controlled  Post assessment: Patient's Cardiovascular Status Stable  Last Vitals:  Filed Vitals:   12/03/14 1145  BP: 148/52  Pulse: 51  Temp: 36.2 C  Resp: 12    Post vital signs: Reviewed and stable  Level of consciousness: sedated  Complications: No apparent anesthesia complications

## 2014-12-03 NOTE — Progress Notes (Signed)
OOB walked to bathroom and to nsg station.tol well

## 2014-12-03 NOTE — Anesthesia Preprocedure Evaluation (Signed)
Anesthesia Evaluation  Patient identified by MRN, date of birth, ID band Patient awake    Reviewed: Allergy & Precautions, NPO status , Patient's Chart, lab work & pertinent test results  Airway Mallampati: I  TM Distance: >3 FB Neck ROM: Full    Dental   Pulmonary    Pulmonary exam normal       Cardiovascular hypertension, Pt. on medications Normal cardiovascular exam    Neuro/Psych    GI/Hepatic GERD-  Medicated and Controlled,  Endo/Other    Renal/GU      Musculoskeletal   Abdominal   Peds  Hematology   Anesthesia Other Findings   Reproductive/Obstetrics                             Anesthesia Physical Anesthesia Plan  ASA: II  Anesthesia Plan: General   Post-op Pain Management:    Induction: Intravenous  Airway Management Planned: Oral ETT  Additional Equipment:   Intra-op Plan:   Post-operative Plan: Extubation in OR  Informed Consent: I have reviewed the patients History and Physical, chart, labs and discussed the procedure including the risks, benefits and alternatives for the proposed anesthesia with the patient or authorized representative who has indicated his/her understanding and acceptance.     Plan Discussed with: CRNA and Surgeon  Anesthesia Plan Comments:         Anesthesia Quick Evaluation  

## 2014-12-03 NOTE — Interval H&P Note (Signed)
History and Physical Interval Note:  12/03/2014 7:19 AM  Laura Romero  has presented today for surgery, with the diagnosis of Left L4-5 Disc Protrusion, Lateral Recess Stenosis  The various methods of treatment have been discussed with the patient and family. After consideration of risks, benefits and other options for treatment, the patient has consented to  Procedure(s): Left L4-5 Lateral Recess Decompression, Microdiscectomy (Left) as a surgical intervention .  The patient's history has been reviewed, patient examined, no change in status, stable for surgery.  I have reviewed the patient's chart and labs.  Questions were answered to the patient's satisfaction.     Kenetra Hildenbrand C

## 2014-12-04 DIAGNOSIS — Z79899 Other long term (current) drug therapy: Secondary | ICD-10-CM | POA: Diagnosis not present

## 2014-12-04 DIAGNOSIS — I10 Essential (primary) hypertension: Secondary | ICD-10-CM | POA: Diagnosis not present

## 2014-12-04 DIAGNOSIS — M5126 Other intervertebral disc displacement, lumbar region: Secondary | ICD-10-CM | POA: Diagnosis not present

## 2014-12-04 NOTE — Plan of Care (Signed)
Problem: Consults Goal: Diagnosis - Spinal Surgery Microdiscectomy

## 2014-12-04 NOTE — Op Note (Signed)
Laura Romero, Laura Romero NO.:  [id number]  MEDICAL RECORD NO.:  [id number]  LOCATION:  [id number]                        FACILITY:  [facility]  PHYSICIAN:  Rudie Rikard C. Lorin Romero, M.D.    DATE OF BIRTH:  March 13, 1940  DATE OF PROCEDURE:  12/03/2014 DATE OF DISCHARGE:                              OPERATIVE REPORT   PREOPERATIVE DIAGNOSIS:  L4-5 disk protrusion, recurrent with lateral recess compression.  POSTOPERATIVE DIAGNOSIS:  L4-5 disk protrusion, recurrent with lateral recess compression.  PROCEDURE:  Left L4-5 microdiskectomy for recurrent herniated nucleus pulposus, lateral recess decompression.  SURGEON:  Ameilia Rattan C. Lorin Romero, M.D.  ASSISTANT:  Alyson Locket. Ricard Dillon, PA-C, medically necessary and present for the entire procedure.  ESTIMATED BLOOD LOSS:  Minimal.  DRAINS:  None.  COMPLICATIONS:  None.  DESCRIPTION OF PROCEDURE:  After induction of general anesthesia, the patient was placed prone, Ancef was given prophylactically.  Careful padding, chest rolls yellow pads underneath the forearm, and rolled underneath the shoulders.  Calf bumpers were applied.  Back was prepped with DuraPrep.  The area was squared with towels, Betadine, Steri-Drape applied after the old incision had been outlined at L4-5 level from where the patient had surgery approximately 30 years ago.  Needle localization, cross-table lateral x-ray showed that the needle was above the L4-5 interspace.  Old incision was opened.  Subperiosteal dissection, Taylor retractor placed laterally.  There was extensive scar tissue present over the lamina and partial resection of the lamina. Scar tissue had to be removed and starting at the facet, the bony edge of the inferior aspect of the remaining lamina was followed.  Once the lamina was adequately visualized, a Penfield 4 was placed.  X-ray was taken, which confirmed it was overlying the disk space.  Lamina remaining portion was thinned with a bur leaving the top half  of the lamina and then with the operative draped microscope brought in, bone was removed following the lateral gutter out to the level of the pedicle.  Nerve root was identified at its shoulder.  Thick chunks of ligament were removed, which was causing lateral recess stenosis, and there was prominent disk protrusion with some cephalad and caudad disk underneath the ligament causing dorsal displacement of the nerve root. With the D'Errico protecting the midline angled in line at the nerve root, annulus was incised with a 15 blade.  Chunks of disk were removed. Micropituitary and Epstein curettes were used.  Continued removal of disk until the disk was decompressed.  Multiple chunks of disk were removed, which allowed the disk to be flat and not cause compression. Some further bone was removed.  Bone taken off the top of the nerve root as it entered the foramina.  Palpation of the hockey-stick inferior to the edge of the pedicle and across the midline was performed with good decompression.  Nerve root was free, some remaining small pieces of retained ligament were removed.  Bipolar cautery was used.  Operative field was dry.  Final passes were made in the disk to make sure there were no remaining chunks.  There was good decompression of the lateral recess.  Nerve root was free, was visualized as it separated from the midline structures exited  out underneath the pedicle with no areas of compression and the axillary nerve root was free.  Irrigation with saline solution.  Standard layered closure with #1 Vicryl on the fascia, 2-0 Vicryl subcutaneous tissue, subcuticular closure.  Dermabond on skin, postop dressing, and transferred to the recovery room.  Instrument count and needle count was correct.     Laura Romero, M.D.     MCY/MEDQ  D:  12/03/2014  T:  12/04/2014  Job:  [id number]

## 2014-12-04 NOTE — Progress Notes (Signed)
Subjective: 1 Day Post-Op Procedure(s) (LRB): Left Re-exploration L4-5 Lateral Recess Decompression, Microdiscectomy (Left) Patient reports pain as moderate.  Doing really well though; up in her room ambulating easily and dressed. Objective: Vital signs in last 24 hours: Temp:  [97.1 F (36.2 C)-98.4 F (36.9 C)] 98.4 F (36.9 C) (10/15 0427) Pulse Rate:  [51-65] 65 (10/15 0427) Resp:  [12-18] 18 (10/15 0427) BP: (146-161)/(49-64) 147/49 mmHg (10/15 0427) SpO2:  [98 %-100 %] 99 % (10/15 0427)  Intake/Output from previous day: 10/14 0701 - 10/15 0700 In: 1740 [P.O.:240; I.V.:1500] Out: 40 [Blood:40] Intake/Output this shift:     Recent Labs  12/02/14 1148  HGB 12.4    Recent Labs  12/02/14 1148  WBC 6.8  RBC 4.19  HCT 36.8  PLT 312    Recent Labs  12/02/14 1148  NA 135  K 3.8  CL 99*  CO2 29  BUN 9  CREATININE 0.83  GLUCOSE 104*  CALCIUM 9.4    Recent Labs  12/02/14 1148  INR 1.08    Sensation intact distally Intact pulses distally Dorsiflexion/Plantar flexion intact Incision: dressing C/D/I  Assessment/Plan: 1 Day Post-Op Procedure(s) (LRB): Left Re-exploration L4-5 Lateral Recess Decompression, Microdiscectomy (Left) Discharge to home today  Aleksei Goodlin Y 12/04/2014, 11:28 AM

## 2014-12-04 NOTE — Discharge Summary (Signed)
Patient ID: Laura Romero MRN: [id number] DOB/AGE: January 24, 1941 74 y.o.  Admit date: 12/03/2014 Discharge date: 12/04/2014  Admission Diagnoses:  Active Problems:   S/P lumbar discectomy   Discharge Diagnoses:  Same  Past Medical History  Diagnosis Date  . Hypertension   . GERD (gastroesophageal reflux disease)   . Esophageal stricture   . Status post dilatation of esophageal stricture   . History of left bundle branch block (LBBB)     Surgeries: Procedure(s): Left Re-exploration L4-5 Lateral Recess Decompression, Microdiscectomy on 12/03/2014   Consultants:    Discharged Condition: Improved  Hospital Course: Laura Romero is an 74 y.o. female who was admitted 12/03/2014 for operative treatment of<principal problem not specified>. Patient has severe unremitting pain that affects sleep, daily activities, and work/hobbies. After pre-op clearance the patient was taken to the operating room on 12/03/2014 and underwent  Procedure(s): Left Re-exploration L4-5 Lateral Recess Decompression, Microdiscectomy.    Patient was given perioperative antibiotics: Anti-infectives    Start     Dose/Rate Route Frequency Ordered Stop   12/03/14 1200  ceFAZolin (ANCEF) IVPB 1 g/50 mL premix     1 g 100 mL/hr over 30 Minutes Intravenous Every 8 hours 12/03/14 1122 12/03/14 2029   12/03/14 0700  ceFAZolin (ANCEF) IVPB 2 g/50 mL premix  Status:  Discontinued     2 g 100 mL/hr over 30 Minutes Intravenous To ShortStay Surgical 12/02/14 1415 12/03/14 1107       Patient was given sequential compression devices, early ambulation, and chemoprophylaxis to prevent DVT.  Patient benefited maximally from hospital stay and there were no complications.    Recent vital signs: Patient Vitals for the past 24 hrs:  BP Temp Temp src Pulse Resp SpO2  12/04/14 0427 (!) 147/49 mmHg 98.4 F (36.9 C) Oral 65 18 99 %  12/04/14 0011 (!) 146/56 mmHg 98.3 F (36.8 C) Oral 61 18 98 %  12/03/14 2018 (!)  161/64 mmHg 97.6 F (36.4 C) Oral 64 16 100 %  12/03/14 1145 (!) 148/52 mmHg 97.1 F (36.2 C) Oral (!) 51 12 100 %     Recent laboratory studies:  Recent Labs  12/02/14 1148  WBC 6.8  HGB 12.4  HCT 36.8  PLT 312  NA 135  K 3.8  CL 99*  CO2 29  BUN 9  CREATININE 0.83  GLUCOSE 104*  INR 1.08  CALCIUM 9.4     Discharge Medications:     Medication List    STOP taking these medications        acetaminophen 500 MG tablet  Commonly known as:  TYLENOL     aspirin 81 MG tablet     traMADol 50 MG tablet  Commonly known as:  ULTRAM      TAKE these medications        alendronate 70 MG tablet  Commonly known as:  FOSAMAX  Take 70 mg by mouth once a week. Take with a full glass of water on an empty stomach.     atenolol-chlorthalidone 50-25 MG tablet  Commonly known as:  TENORETIC  Take 1 tablet by mouth daily.     CALCIUM + D PO  Take 1 tablet by mouth daily.     folic acid 350 MCG tablet  Commonly known as:  FOLVITE  Take 800 mcg by mouth daily.     methocarbamol 500 MG tablet  Commonly known as:  ROBAXIN  Take 1 tablet (500 mg total) by mouth 4 (four) times daily.  omeprazole 20 MG capsule  Commonly known as:  PRILOSEC  Take 20 mg by mouth daily.     oxyCODONE-acetaminophen 5-325 MG tablet  Commonly known as:  ROXICET  Take 1 tablet by mouth every 6 (six) hours as needed for severe pain.        Diagnostic Studies: Dg Chest 2 View  12/02/2014  CLINICAL DATA:  Spinal stenosis. EXAM: CHEST  2 VIEW COMPARISON:  01/21/2012 . FINDINGS: Mediastinum hilar structures normal. Cardiomegaly. Stable nodular apical pleural parenchymal thickening noted consistent with scarring. No pleural effusion or pneumothorax. No acute bony abnormality . IMPRESSION: No acute pulmonary disease.  Mild cardiomegaly. Electronically Signed   By: Marcello Moores  Register   On: 12/02/2014 12:52   Dg Lumbar Spine 2-3 Views  12/03/2014  CLINICAL DATA:  Disc surgery. EXAM: LUMBAR SPINE - 2-3  VIEW COMPARISON:  MRI 10/16/2014. FINDINGS: Lumbar vertebra numbered as per prior MRI. Metallic marker noted posteriorly at the level of L4. Multilevel degenerative change. No acute abnormality. IMPRESSION: Metallic marker noted posteriorly at the level of L4. Electronically Signed   By: Marcello Moores  Register   On: 12/03/2014 09:15    Disposition: to home      Discharge Instructions    Call MD / Call 911    Complete by:  As directed   If you experience chest pain or shortness of breath, CALL 911 and be transported to the hospital emergency room.  If you develope a fever above 101 F, pus (white drainage) or increased drainage or redness at the wound, or calf pain, call your surgeon's office.     Constipation Prevention    Complete by:  As directed   Drink plenty of fluids.  Prune juice may be helpful.  You may use a stool softener, such as Colace (over the counter) 100 mg twice a day.  Use MiraLax (over the counter) for constipation as needed.     Diet - low sodium heart healthy    Complete by:  As directed      Discharge instructions    Complete by:  As directed   Ok to shower 5 days postop.  Do not apply any creams or ointments to incision.  Do not remove steri-strips.  Can use 4x4 gauze and tape for dressing changes.  No aggressive activity.  No bending, squatting or prolonged sitting.  Mostly be in reclined position or lying down.     Discharge patient    Complete by:  As directed      Driving restrictions    Complete by:  As directed   No driving     Increase activity slowly as tolerated    Complete by:  As directed      Lifting restrictions    Complete by:  As directed   No lifting           Follow-up Information    Schedule an appointment as soon as possible for a visit with Marybelle Killings, MD.   Specialty:  Orthopedic Surgery   Why:  need return office visit one week postop   Contact information:   [place] [address] [phone]         Signed: Mcarthur Rossetti 12/04/2014, 11:30 AM

## 2014-12-06 ENCOUNTER — Encounter (HOSPITAL_COMMUNITY): Payer: Self-pay | Admitting: Orthopaedic Surgery

## 2014-12-07 DIAGNOSIS — M5416 Radiculopathy, lumbar region: Secondary | ICD-10-CM | POA: Diagnosis not present

## 2014-12-13 DIAGNOSIS — H01005 Unspecified blepharitis left lower eyelid: Secondary | ICD-10-CM | POA: Diagnosis not present

## 2014-12-13 DIAGNOSIS — H01004 Unspecified blepharitis left upper eyelid: Secondary | ICD-10-CM | POA: Diagnosis not present

## 2014-12-13 DIAGNOSIS — H01001 Unspecified blepharitis right upper eyelid: Secondary | ICD-10-CM | POA: Diagnosis not present

## 2014-12-13 DIAGNOSIS — H01002 Unspecified blepharitis right lower eyelid: Secondary | ICD-10-CM | POA: Diagnosis not present

## 2014-12-17 DIAGNOSIS — Z6823 Body mass index (BMI) 23.0-23.9, adult: Secondary | ICD-10-CM | POA: Diagnosis not present

## 2014-12-17 DIAGNOSIS — R399 Unspecified symptoms and signs involving the genitourinary system: Secondary | ICD-10-CM | POA: Diagnosis not present

## 2015-01-10 DIAGNOSIS — M8580 Other specified disorders of bone density and structure, unspecified site: Secondary | ICD-10-CM | POA: Diagnosis not present

## 2015-01-10 DIAGNOSIS — I10 Essential (primary) hypertension: Secondary | ICD-10-CM | POA: Diagnosis not present

## 2015-01-10 DIAGNOSIS — R7301 Impaired fasting glucose: Secondary | ICD-10-CM | POA: Diagnosis not present

## 2015-01-10 DIAGNOSIS — Z23 Encounter for immunization: Secondary | ICD-10-CM | POA: Diagnosis not present

## 2015-03-17 DIAGNOSIS — Z1231 Encounter for screening mammogram for malignant neoplasm of breast: Secondary | ICD-10-CM | POA: Diagnosis not present

## 2015-03-22 DIAGNOSIS — L821 Other seborrheic keratosis: Secondary | ICD-10-CM | POA: Diagnosis not present

## 2015-03-22 DIAGNOSIS — C44311 Basal cell carcinoma of skin of nose: Secondary | ICD-10-CM | POA: Diagnosis not present

## 2015-05-09 DIAGNOSIS — Z6824 Body mass index (BMI) 24.0-24.9, adult: Secondary | ICD-10-CM | POA: Diagnosis not present

## 2015-05-09 DIAGNOSIS — M65311 Trigger thumb, right thumb: Secondary | ICD-10-CM | POA: Diagnosis not present

## 2015-05-18 DIAGNOSIS — M65311 Trigger thumb, right thumb: Secondary | ICD-10-CM | POA: Diagnosis not present

## 2015-05-18 DIAGNOSIS — M19041 Primary osteoarthritis, right hand: Secondary | ICD-10-CM | POA: Diagnosis not present

## 2015-06-14 DIAGNOSIS — I447 Left bundle-branch block, unspecified: Secondary | ICD-10-CM | POA: Diagnosis not present

## 2015-06-14 DIAGNOSIS — Z6823 Body mass index (BMI) 23.0-23.9, adult: Secondary | ICD-10-CM | POA: Diagnosis not present

## 2015-06-14 DIAGNOSIS — I10 Essential (primary) hypertension: Secondary | ICD-10-CM | POA: Diagnosis not present

## 2015-06-14 DIAGNOSIS — M65311 Trigger thumb, right thumb: Secondary | ICD-10-CM | POA: Diagnosis not present

## 2015-06-20 DIAGNOSIS — M65311 Trigger thumb, right thumb: Secondary | ICD-10-CM | POA: Diagnosis not present

## 2015-06-23 DIAGNOSIS — Z79899 Other long term (current) drug therapy: Secondary | ICD-10-CM | POA: Diagnosis not present

## 2015-06-23 DIAGNOSIS — K219 Gastro-esophageal reflux disease without esophagitis: Secondary | ICD-10-CM | POA: Diagnosis not present

## 2015-06-23 DIAGNOSIS — I10 Essential (primary) hypertension: Secondary | ICD-10-CM | POA: Diagnosis not present

## 2015-06-23 DIAGNOSIS — M65311 Trigger thumb, right thumb: Secondary | ICD-10-CM | POA: Diagnosis not present

## 2015-06-23 DIAGNOSIS — I447 Left bundle-branch block, unspecified: Secondary | ICD-10-CM | POA: Diagnosis not present

## 2015-06-23 DIAGNOSIS — G2581 Restless legs syndrome: Secondary | ICD-10-CM | POA: Diagnosis not present

## 2015-06-27 DIAGNOSIS — M6281 Muscle weakness (generalized): Secondary | ICD-10-CM | POA: Diagnosis not present

## 2015-06-27 DIAGNOSIS — M25641 Stiffness of right hand, not elsewhere classified: Secondary | ICD-10-CM | POA: Diagnosis not present

## 2015-06-27 DIAGNOSIS — M65311 Trigger thumb, right thumb: Secondary | ICD-10-CM | POA: Diagnosis not present

## 2015-06-27 DIAGNOSIS — M25541 Pain in joints of right hand: Secondary | ICD-10-CM | POA: Diagnosis not present

## 2015-07-01 DIAGNOSIS — M25541 Pain in joints of right hand: Secondary | ICD-10-CM | POA: Diagnosis not present

## 2015-07-01 DIAGNOSIS — M6281 Muscle weakness (generalized): Secondary | ICD-10-CM | POA: Diagnosis not present

## 2015-07-01 DIAGNOSIS — M65311 Trigger thumb, right thumb: Secondary | ICD-10-CM | POA: Diagnosis not present

## 2015-07-01 DIAGNOSIS — M25641 Stiffness of right hand, not elsewhere classified: Secondary | ICD-10-CM | POA: Diagnosis not present

## 2015-07-11 DIAGNOSIS — H01001 Unspecified blepharitis right upper eyelid: Secondary | ICD-10-CM | POA: Diagnosis not present

## 2015-07-11 DIAGNOSIS — H01005 Unspecified blepharitis left lower eyelid: Secondary | ICD-10-CM | POA: Diagnosis not present

## 2015-07-11 DIAGNOSIS — H01004 Unspecified blepharitis left upper eyelid: Secondary | ICD-10-CM | POA: Diagnosis not present

## 2015-07-11 DIAGNOSIS — H01002 Unspecified blepharitis right lower eyelid: Secondary | ICD-10-CM | POA: Diagnosis not present

## 2015-07-26 DIAGNOSIS — H524 Presbyopia: Secondary | ICD-10-CM | POA: Diagnosis not present

## 2015-07-26 DIAGNOSIS — H5210 Myopia, unspecified eye: Secondary | ICD-10-CM | POA: Diagnosis not present

## 2015-09-07 DIAGNOSIS — S46911A Strain of unspecified muscle, fascia and tendon at shoulder and upper arm level, right arm, initial encounter: Secondary | ICD-10-CM | POA: Diagnosis not present

## 2015-09-13 DIAGNOSIS — Z6824 Body mass index (BMI) 24.0-24.9, adult: Secondary | ICD-10-CM | POA: Diagnosis not present

## 2015-09-13 DIAGNOSIS — M19011 Primary osteoarthritis, right shoulder: Secondary | ICD-10-CM | POA: Diagnosis not present

## 2015-09-13 DIAGNOSIS — M25511 Pain in right shoulder: Secondary | ICD-10-CM | POA: Diagnosis not present

## 2015-09-20 DIAGNOSIS — M7541 Impingement syndrome of right shoulder: Secondary | ICD-10-CM | POA: Diagnosis not present

## 2015-09-20 DIAGNOSIS — M25511 Pain in right shoulder: Secondary | ICD-10-CM | POA: Diagnosis not present

## 2015-09-21 DIAGNOSIS — M7541 Impingement syndrome of right shoulder: Secondary | ICD-10-CM | POA: Diagnosis not present

## 2015-09-21 DIAGNOSIS — M25511 Pain in right shoulder: Secondary | ICD-10-CM | POA: Diagnosis not present

## 2015-09-21 DIAGNOSIS — M25611 Stiffness of right shoulder, not elsewhere classified: Secondary | ICD-10-CM | POA: Diagnosis not present

## 2015-10-03 DIAGNOSIS — M25611 Stiffness of right shoulder, not elsewhere classified: Secondary | ICD-10-CM | POA: Diagnosis not present

## 2015-10-03 DIAGNOSIS — M7541 Impingement syndrome of right shoulder: Secondary | ICD-10-CM | POA: Diagnosis not present

## 2015-10-03 DIAGNOSIS — M25511 Pain in right shoulder: Secondary | ICD-10-CM | POA: Diagnosis not present

## 2015-10-11 DIAGNOSIS — M7541 Impingement syndrome of right shoulder: Secondary | ICD-10-CM | POA: Diagnosis not present

## 2015-10-11 DIAGNOSIS — M25511 Pain in right shoulder: Secondary | ICD-10-CM | POA: Diagnosis not present

## 2015-10-11 DIAGNOSIS — M25611 Stiffness of right shoulder, not elsewhere classified: Secondary | ICD-10-CM | POA: Diagnosis not present

## 2015-10-13 DIAGNOSIS — K219 Gastro-esophageal reflux disease without esophagitis: Secondary | ICD-10-CM | POA: Diagnosis not present

## 2015-10-13 DIAGNOSIS — Z6823 Body mass index (BMI) 23.0-23.9, adult: Secondary | ICD-10-CM | POA: Diagnosis not present

## 2015-10-13 DIAGNOSIS — I10 Essential (primary) hypertension: Secondary | ICD-10-CM | POA: Diagnosis not present

## 2015-10-13 DIAGNOSIS — R7301 Impaired fasting glucose: Secondary | ICD-10-CM | POA: Diagnosis not present

## 2015-10-13 DIAGNOSIS — M8580 Other specified disorders of bone density and structure, unspecified site: Secondary | ICD-10-CM | POA: Diagnosis not present

## 2015-10-13 DIAGNOSIS — Z23 Encounter for immunization: Secondary | ICD-10-CM | POA: Diagnosis not present

## 2015-10-19 DIAGNOSIS — M7541 Impingement syndrome of right shoulder: Secondary | ICD-10-CM | POA: Diagnosis not present

## 2015-11-04 DIAGNOSIS — S52501A Unspecified fracture of the lower end of right radius, initial encounter for closed fracture: Secondary | ICD-10-CM | POA: Diagnosis not present

## 2015-11-08 DIAGNOSIS — S52501A Unspecified fracture of the lower end of right radius, initial encounter for closed fracture: Secondary | ICD-10-CM | POA: Diagnosis not present

## 2015-11-16 IMAGING — CR DG LUMBAR SPINE 2-3V
2 series · 2 of 2 positions shown · non-contrast
Comparison: MRI 10/16/2014.

CLINICAL DATA: Disc surgery.

EXAM:
LUMBAR SPINE - 2-3 VIEW

[lateral (1 of 2)]
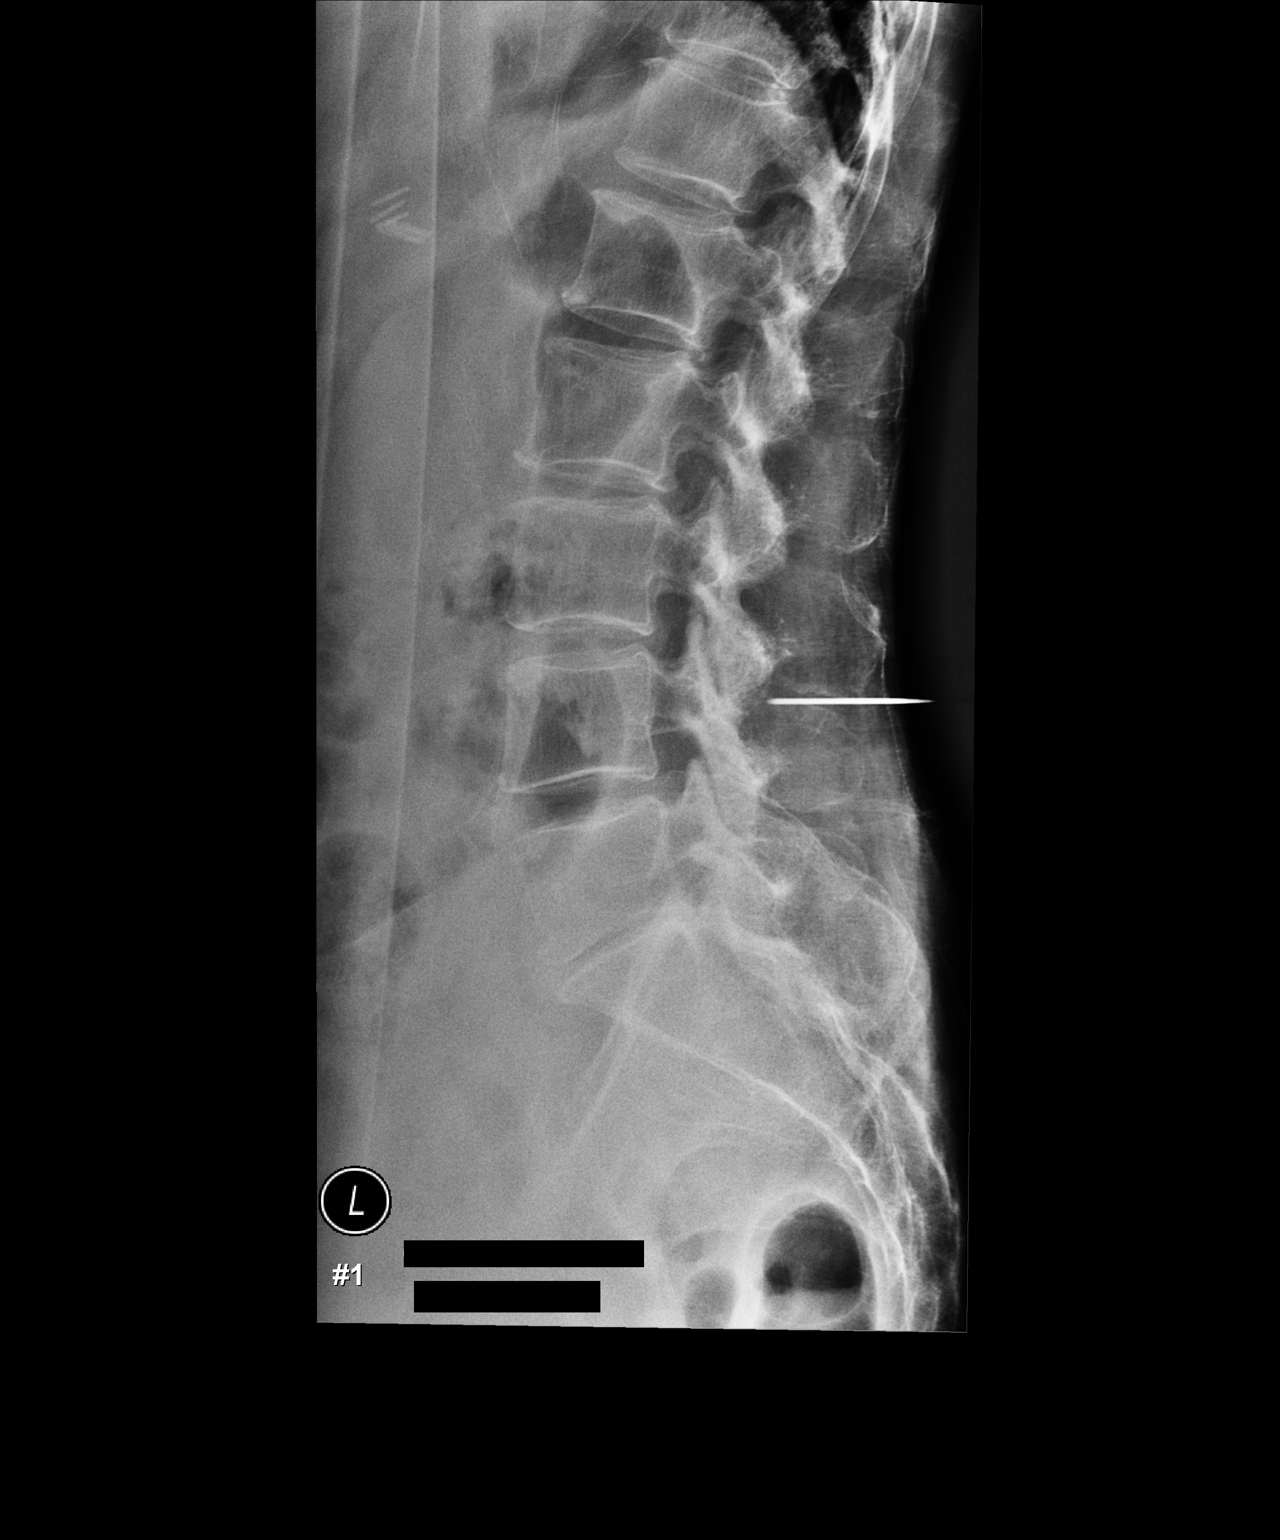

[lateral (2 of 2)]
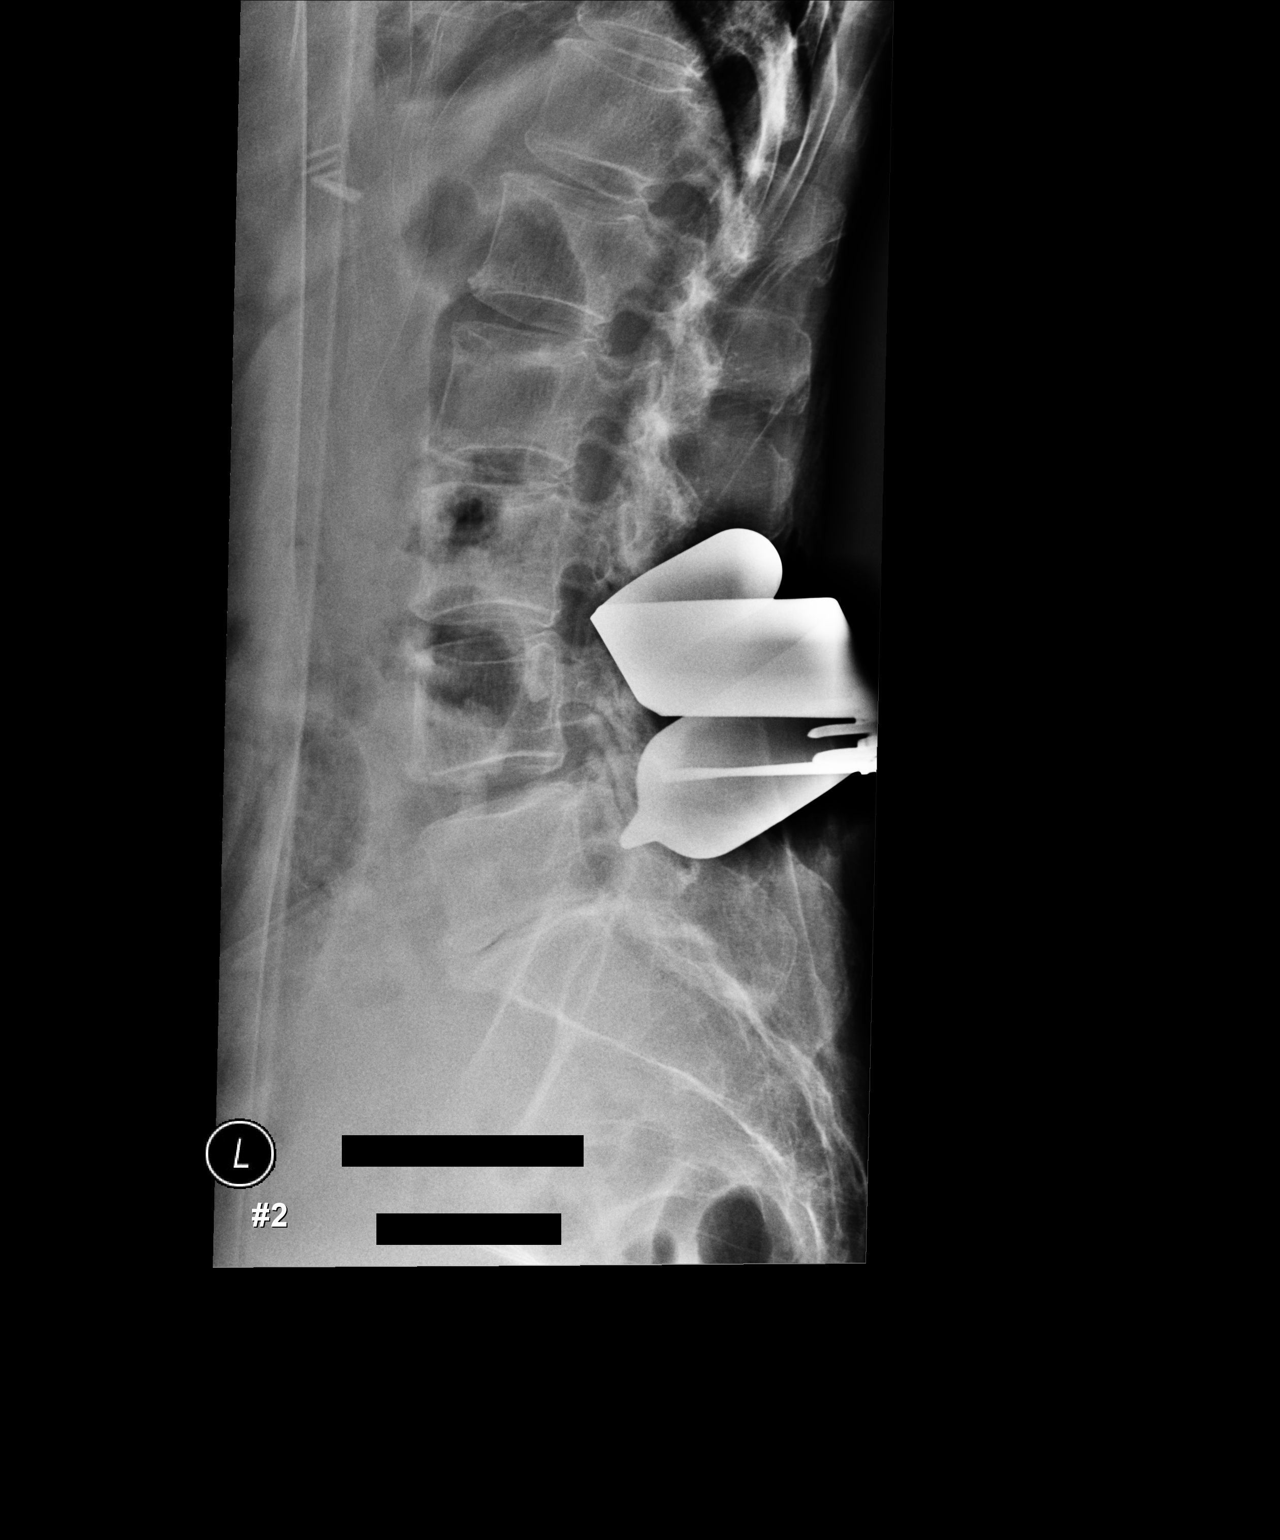

[2 of 2 positions shown; findings below may reference images not displayed]

FINDINGS: Lumbar vertebra numbered as per prior MRI. Metallic marker noted
posteriorly at the level of L4. Multilevel degenerative change. No
acute abnormality.
IMPRESSION: Metallic marker noted posteriorly at the level of L4.

## 2015-11-22 DIAGNOSIS — S52501D Unspecified fracture of the lower end of right radius, subsequent encounter for closed fracture with routine healing: Secondary | ICD-10-CM | POA: Diagnosis not present

## 2015-12-06 DIAGNOSIS — S52501D Unspecified fracture of the lower end of right radius, subsequent encounter for closed fracture with routine healing: Secondary | ICD-10-CM | POA: Diagnosis not present

## 2015-12-16 DIAGNOSIS — Z23 Encounter for immunization: Secondary | ICD-10-CM | POA: Diagnosis not present

## 2015-12-20 DIAGNOSIS — S52501D Unspecified fracture of the lower end of right radius, subsequent encounter for closed fracture with routine healing: Secondary | ICD-10-CM | POA: Diagnosis not present

## 2016-01-20 DIAGNOSIS — S52501D Unspecified fracture of the lower end of right radius, subsequent encounter for closed fracture with routine healing: Secondary | ICD-10-CM | POA: Diagnosis not present

## 2016-02-06 DIAGNOSIS — S52501K Unspecified fracture of the lower end of right radius, subsequent encounter for closed fracture with nonunion: Secondary | ICD-10-CM | POA: Diagnosis not present

## 2016-02-08 DIAGNOSIS — S52501K Unspecified fracture of the lower end of right radius, subsequent encounter for closed fracture with nonunion: Secondary | ICD-10-CM | POA: Diagnosis not present

## 2016-03-05 DIAGNOSIS — S52501D Unspecified fracture of the lower end of right radius, subsequent encounter for closed fracture with routine healing: Secondary | ICD-10-CM | POA: Diagnosis not present

## 2016-03-12 DIAGNOSIS — M25512 Pain in left shoulder: Secondary | ICD-10-CM | POA: Diagnosis not present

## 2016-03-15 DIAGNOSIS — M25512 Pain in left shoulder: Secondary | ICD-10-CM | POA: Diagnosis not present

## 2016-03-19 DIAGNOSIS — M25512 Pain in left shoulder: Secondary | ICD-10-CM | POA: Diagnosis not present

## 2016-03-23 DIAGNOSIS — M25512 Pain in left shoulder: Secondary | ICD-10-CM | POA: Diagnosis not present

## 2016-04-09 DIAGNOSIS — M7542 Impingement syndrome of left shoulder: Secondary | ICD-10-CM | POA: Diagnosis not present

## 2016-04-17 DIAGNOSIS — K219 Gastro-esophageal reflux disease without esophagitis: Secondary | ICD-10-CM | POA: Diagnosis not present

## 2016-04-17 DIAGNOSIS — Z6824 Body mass index (BMI) 24.0-24.9, adult: Secondary | ICD-10-CM | POA: Diagnosis not present

## 2016-04-17 DIAGNOSIS — I10 Essential (primary) hypertension: Secondary | ICD-10-CM | POA: Diagnosis not present

## 2016-04-17 DIAGNOSIS — R7301 Impaired fasting glucose: Secondary | ICD-10-CM | POA: Diagnosis not present

## 2016-04-17 DIAGNOSIS — M8589 Other specified disorders of bone density and structure, multiple sites: Secondary | ICD-10-CM | POA: Diagnosis not present

## 2016-04-17 DIAGNOSIS — Z1231 Encounter for screening mammogram for malignant neoplasm of breast: Secondary | ICD-10-CM | POA: Diagnosis not present

## 2016-04-25 DIAGNOSIS — Z1231 Encounter for screening mammogram for malignant neoplasm of breast: Secondary | ICD-10-CM | POA: Diagnosis not present

## 2016-05-10 DIAGNOSIS — M25512 Pain in left shoulder: Secondary | ICD-10-CM | POA: Diagnosis not present

## 2016-05-10 DIAGNOSIS — M75102 Unspecified rotator cuff tear or rupture of left shoulder, not specified as traumatic: Secondary | ICD-10-CM | POA: Diagnosis not present

## 2016-05-14 DIAGNOSIS — M75122 Complete rotator cuff tear or rupture of left shoulder, not specified as traumatic: Secondary | ICD-10-CM | POA: Diagnosis not present

## 2016-05-28 DIAGNOSIS — M75122 Complete rotator cuff tear or rupture of left shoulder, not specified as traumatic: Secondary | ICD-10-CM | POA: Diagnosis not present

## 2016-05-31 DIAGNOSIS — Z7982 Long term (current) use of aspirin: Secondary | ICD-10-CM | POA: Diagnosis not present

## 2016-05-31 DIAGNOSIS — S46912A Strain of unspecified muscle, fascia and tendon at shoulder and upper arm level, left arm, initial encounter: Secondary | ICD-10-CM | POA: Diagnosis not present

## 2016-05-31 DIAGNOSIS — G2581 Restless legs syndrome: Secondary | ICD-10-CM | POA: Diagnosis not present

## 2016-05-31 DIAGNOSIS — M7542 Impingement syndrome of left shoulder: Secondary | ICD-10-CM | POA: Diagnosis not present

## 2016-05-31 DIAGNOSIS — M75122 Complete rotator cuff tear or rupture of left shoulder, not specified as traumatic: Secondary | ICD-10-CM | POA: Diagnosis not present

## 2016-05-31 DIAGNOSIS — K219 Gastro-esophageal reflux disease without esophagitis: Secondary | ICD-10-CM | POA: Diagnosis not present

## 2016-05-31 DIAGNOSIS — G8918 Other acute postprocedural pain: Secondary | ICD-10-CM | POA: Diagnosis not present

## 2016-05-31 DIAGNOSIS — M65812 Other synovitis and tenosynovitis, left shoulder: Secondary | ICD-10-CM | POA: Diagnosis not present

## 2016-05-31 DIAGNOSIS — Z79891 Long term (current) use of opiate analgesic: Secondary | ICD-10-CM | POA: Diagnosis not present

## 2016-05-31 DIAGNOSIS — I10 Essential (primary) hypertension: Secondary | ICD-10-CM | POA: Diagnosis not present

## 2016-06-04 DIAGNOSIS — M25512 Pain in left shoulder: Secondary | ICD-10-CM | POA: Diagnosis not present

## 2016-06-04 DIAGNOSIS — M6281 Muscle weakness (generalized): Secondary | ICD-10-CM | POA: Diagnosis not present

## 2016-06-04 DIAGNOSIS — M75122 Complete rotator cuff tear or rupture of left shoulder, not specified as traumatic: Secondary | ICD-10-CM | POA: Diagnosis not present

## 2016-06-08 DIAGNOSIS — M75122 Complete rotator cuff tear or rupture of left shoulder, not specified as traumatic: Secondary | ICD-10-CM | POA: Diagnosis not present

## 2016-06-08 DIAGNOSIS — M6281 Muscle weakness (generalized): Secondary | ICD-10-CM | POA: Diagnosis not present

## 2016-06-08 DIAGNOSIS — M25512 Pain in left shoulder: Secondary | ICD-10-CM | POA: Diagnosis not present

## 2016-06-12 DIAGNOSIS — M6281 Muscle weakness (generalized): Secondary | ICD-10-CM | POA: Diagnosis not present

## 2016-06-12 DIAGNOSIS — M25512 Pain in left shoulder: Secondary | ICD-10-CM | POA: Diagnosis not present

## 2016-06-12 DIAGNOSIS — M75122 Complete rotator cuff tear or rupture of left shoulder, not specified as traumatic: Secondary | ICD-10-CM | POA: Diagnosis not present

## 2016-06-15 DIAGNOSIS — M25512 Pain in left shoulder: Secondary | ICD-10-CM | POA: Diagnosis not present

## 2016-06-15 DIAGNOSIS — M75122 Complete rotator cuff tear or rupture of left shoulder, not specified as traumatic: Secondary | ICD-10-CM | POA: Diagnosis not present

## 2016-06-15 DIAGNOSIS — M6281 Muscle weakness (generalized): Secondary | ICD-10-CM | POA: Diagnosis not present

## 2016-06-18 DIAGNOSIS — M75122 Complete rotator cuff tear or rupture of left shoulder, not specified as traumatic: Secondary | ICD-10-CM | POA: Diagnosis not present

## 2016-06-18 DIAGNOSIS — M6281 Muscle weakness (generalized): Secondary | ICD-10-CM | POA: Diagnosis not present

## 2016-06-18 DIAGNOSIS — M25512 Pain in left shoulder: Secondary | ICD-10-CM | POA: Diagnosis not present

## 2016-06-20 DIAGNOSIS — M75122 Complete rotator cuff tear or rupture of left shoulder, not specified as traumatic: Secondary | ICD-10-CM | POA: Diagnosis not present

## 2016-06-20 DIAGNOSIS — M6281 Muscle weakness (generalized): Secondary | ICD-10-CM | POA: Diagnosis not present

## 2016-06-20 DIAGNOSIS — M25512 Pain in left shoulder: Secondary | ICD-10-CM | POA: Diagnosis not present

## 2016-06-25 DIAGNOSIS — M75122 Complete rotator cuff tear or rupture of left shoulder, not specified as traumatic: Secondary | ICD-10-CM | POA: Diagnosis not present

## 2016-06-25 DIAGNOSIS — M6281 Muscle weakness (generalized): Secondary | ICD-10-CM | POA: Diagnosis not present

## 2016-06-25 DIAGNOSIS — M25512 Pain in left shoulder: Secondary | ICD-10-CM | POA: Diagnosis not present

## 2016-06-27 DIAGNOSIS — M75122 Complete rotator cuff tear or rupture of left shoulder, not specified as traumatic: Secondary | ICD-10-CM | POA: Diagnosis not present

## 2016-06-27 DIAGNOSIS — M6281 Muscle weakness (generalized): Secondary | ICD-10-CM | POA: Diagnosis not present

## 2016-06-27 DIAGNOSIS — M25512 Pain in left shoulder: Secondary | ICD-10-CM | POA: Diagnosis not present

## 2016-07-03 DIAGNOSIS — M75122 Complete rotator cuff tear or rupture of left shoulder, not specified as traumatic: Secondary | ICD-10-CM | POA: Diagnosis not present

## 2016-07-03 DIAGNOSIS — M25512 Pain in left shoulder: Secondary | ICD-10-CM | POA: Diagnosis not present

## 2016-07-03 DIAGNOSIS — M6281 Muscle weakness (generalized): Secondary | ICD-10-CM | POA: Diagnosis not present

## 2016-07-05 DIAGNOSIS — M25512 Pain in left shoulder: Secondary | ICD-10-CM | POA: Diagnosis not present

## 2016-07-05 DIAGNOSIS — M6281 Muscle weakness (generalized): Secondary | ICD-10-CM | POA: Diagnosis not present

## 2016-07-05 DIAGNOSIS — M75122 Complete rotator cuff tear or rupture of left shoulder, not specified as traumatic: Secondary | ICD-10-CM | POA: Diagnosis not present

## 2016-07-10 DIAGNOSIS — M25512 Pain in left shoulder: Secondary | ICD-10-CM | POA: Diagnosis not present

## 2016-07-10 DIAGNOSIS — M6281 Muscle weakness (generalized): Secondary | ICD-10-CM | POA: Diagnosis not present

## 2016-07-10 DIAGNOSIS — M75122 Complete rotator cuff tear or rupture of left shoulder, not specified as traumatic: Secondary | ICD-10-CM | POA: Diagnosis not present

## 2016-07-13 DIAGNOSIS — M6281 Muscle weakness (generalized): Secondary | ICD-10-CM | POA: Diagnosis not present

## 2016-07-13 DIAGNOSIS — M25512 Pain in left shoulder: Secondary | ICD-10-CM | POA: Diagnosis not present

## 2016-07-13 DIAGNOSIS — M75122 Complete rotator cuff tear or rupture of left shoulder, not specified as traumatic: Secondary | ICD-10-CM | POA: Diagnosis not present

## 2016-07-20 DIAGNOSIS — M75122 Complete rotator cuff tear or rupture of left shoulder, not specified as traumatic: Secondary | ICD-10-CM | POA: Diagnosis not present

## 2016-07-20 DIAGNOSIS — M6281 Muscle weakness (generalized): Secondary | ICD-10-CM | POA: Diagnosis not present

## 2016-07-20 DIAGNOSIS — M25512 Pain in left shoulder: Secondary | ICD-10-CM | POA: Diagnosis not present

## 2016-07-23 DIAGNOSIS — M25512 Pain in left shoulder: Secondary | ICD-10-CM | POA: Diagnosis not present

## 2016-07-23 DIAGNOSIS — M6281 Muscle weakness (generalized): Secondary | ICD-10-CM | POA: Diagnosis not present

## 2016-07-23 DIAGNOSIS — M75122 Complete rotator cuff tear or rupture of left shoulder, not specified as traumatic: Secondary | ICD-10-CM | POA: Diagnosis not present

## 2016-07-27 DIAGNOSIS — M6281 Muscle weakness (generalized): Secondary | ICD-10-CM | POA: Diagnosis not present

## 2016-07-27 DIAGNOSIS — M25512 Pain in left shoulder: Secondary | ICD-10-CM | POA: Diagnosis not present

## 2016-07-27 DIAGNOSIS — M75122 Complete rotator cuff tear or rupture of left shoulder, not specified as traumatic: Secondary | ICD-10-CM | POA: Diagnosis not present

## 2016-07-30 DIAGNOSIS — M75122 Complete rotator cuff tear or rupture of left shoulder, not specified as traumatic: Secondary | ICD-10-CM | POA: Diagnosis not present

## 2016-07-30 DIAGNOSIS — M6281 Muscle weakness (generalized): Secondary | ICD-10-CM | POA: Diagnosis not present

## 2016-07-30 DIAGNOSIS — M25512 Pain in left shoulder: Secondary | ICD-10-CM | POA: Diagnosis not present

## 2016-08-03 DIAGNOSIS — M75122 Complete rotator cuff tear or rupture of left shoulder, not specified as traumatic: Secondary | ICD-10-CM | POA: Diagnosis not present

## 2016-08-03 DIAGNOSIS — M25512 Pain in left shoulder: Secondary | ICD-10-CM | POA: Diagnosis not present

## 2016-08-03 DIAGNOSIS — M6281 Muscle weakness (generalized): Secondary | ICD-10-CM | POA: Diagnosis not present

## 2016-08-08 DIAGNOSIS — M25512 Pain in left shoulder: Secondary | ICD-10-CM | POA: Diagnosis not present

## 2016-08-08 DIAGNOSIS — M75122 Complete rotator cuff tear or rupture of left shoulder, not specified as traumatic: Secondary | ICD-10-CM | POA: Diagnosis not present

## 2016-08-08 DIAGNOSIS — M6281 Muscle weakness (generalized): Secondary | ICD-10-CM | POA: Diagnosis not present

## 2016-08-10 DIAGNOSIS — M25512 Pain in left shoulder: Secondary | ICD-10-CM | POA: Diagnosis not present

## 2016-08-10 DIAGNOSIS — M6281 Muscle weakness (generalized): Secondary | ICD-10-CM | POA: Diagnosis not present

## 2016-08-10 DIAGNOSIS — M75122 Complete rotator cuff tear or rupture of left shoulder, not specified as traumatic: Secondary | ICD-10-CM | POA: Diagnosis not present

## 2016-08-14 DIAGNOSIS — M6281 Muscle weakness (generalized): Secondary | ICD-10-CM | POA: Diagnosis not present

## 2016-08-14 DIAGNOSIS — M25512 Pain in left shoulder: Secondary | ICD-10-CM | POA: Diagnosis not present

## 2016-08-14 DIAGNOSIS — M75122 Complete rotator cuff tear or rupture of left shoulder, not specified as traumatic: Secondary | ICD-10-CM | POA: Diagnosis not present

## 2016-08-17 DIAGNOSIS — M8580 Other specified disorders of bone density and structure, unspecified site: Secondary | ICD-10-CM | POA: Diagnosis not present

## 2016-08-17 DIAGNOSIS — M85852 Other specified disorders of bone density and structure, left thigh: Secondary | ICD-10-CM | POA: Diagnosis not present

## 2016-08-17 DIAGNOSIS — Z780 Asymptomatic menopausal state: Secondary | ICD-10-CM | POA: Diagnosis not present

## 2016-08-21 DIAGNOSIS — M25512 Pain in left shoulder: Secondary | ICD-10-CM | POA: Diagnosis not present

## 2016-08-21 DIAGNOSIS — M75122 Complete rotator cuff tear or rupture of left shoulder, not specified as traumatic: Secondary | ICD-10-CM | POA: Diagnosis not present

## 2016-08-21 DIAGNOSIS — M6281 Muscle weakness (generalized): Secondary | ICD-10-CM | POA: Diagnosis not present

## 2016-08-30 DIAGNOSIS — M75122 Complete rotator cuff tear or rupture of left shoulder, not specified as traumatic: Secondary | ICD-10-CM | POA: Diagnosis not present

## 2016-08-30 DIAGNOSIS — M6281 Muscle weakness (generalized): Secondary | ICD-10-CM | POA: Diagnosis not present

## 2016-08-30 DIAGNOSIS — M25512 Pain in left shoulder: Secondary | ICD-10-CM | POA: Diagnosis not present

## 2016-09-13 DIAGNOSIS — M25512 Pain in left shoulder: Secondary | ICD-10-CM | POA: Diagnosis not present

## 2016-09-13 DIAGNOSIS — M75122 Complete rotator cuff tear or rupture of left shoulder, not specified as traumatic: Secondary | ICD-10-CM | POA: Diagnosis not present

## 2016-09-13 DIAGNOSIS — M6281 Muscle weakness (generalized): Secondary | ICD-10-CM | POA: Diagnosis not present

## 2016-09-27 DIAGNOSIS — E785 Hyperlipidemia, unspecified: Secondary | ICD-10-CM | POA: Diagnosis not present

## 2016-09-27 DIAGNOSIS — Z0000 Encounter for general adult medical examination without abnormal findings: Secondary | ICD-10-CM | POA: Diagnosis not present

## 2016-09-27 DIAGNOSIS — Z1389 Encounter for screening for other disorder: Secondary | ICD-10-CM | POA: Diagnosis not present

## 2016-09-27 DIAGNOSIS — Z0100 Encounter for examination of eyes and vision without abnormal findings: Secondary | ICD-10-CM | POA: Diagnosis not present

## 2016-09-27 DIAGNOSIS — Z9181 History of falling: Secondary | ICD-10-CM | POA: Diagnosis not present

## 2016-09-27 DIAGNOSIS — G2581 Restless legs syndrome: Secondary | ICD-10-CM | POA: Diagnosis not present

## 2016-10-16 DIAGNOSIS — I10 Essential (primary) hypertension: Secondary | ICD-10-CM | POA: Diagnosis not present

## 2016-10-16 DIAGNOSIS — R7301 Impaired fasting glucose: Secondary | ICD-10-CM | POA: Diagnosis not present

## 2016-10-16 DIAGNOSIS — M8589 Other specified disorders of bone density and structure, multiple sites: Secondary | ICD-10-CM | POA: Diagnosis not present

## 2016-10-16 DIAGNOSIS — Z139 Encounter for screening, unspecified: Secondary | ICD-10-CM | POA: Diagnosis not present

## 2016-10-16 DIAGNOSIS — K219 Gastro-esophageal reflux disease without esophagitis: Secondary | ICD-10-CM | POA: Diagnosis not present

## 2016-10-16 DIAGNOSIS — Z6823 Body mass index (BMI) 23.0-23.9, adult: Secondary | ICD-10-CM | POA: Diagnosis not present

## 2016-10-31 DIAGNOSIS — H35341 Macular cyst, hole, or pseudohole, right eye: Secondary | ICD-10-CM | POA: Diagnosis not present

## 2016-10-31 DIAGNOSIS — H43813 Vitreous degeneration, bilateral: Secondary | ICD-10-CM | POA: Diagnosis not present

## 2016-10-31 DIAGNOSIS — H5203 Hypermetropia, bilateral: Secondary | ICD-10-CM | POA: Diagnosis not present

## 2016-10-31 DIAGNOSIS — H524 Presbyopia: Secondary | ICD-10-CM | POA: Diagnosis not present

## 2016-10-31 DIAGNOSIS — Z961 Presence of intraocular lens: Secondary | ICD-10-CM | POA: Diagnosis not present

## 2016-10-31 DIAGNOSIS — I10 Essential (primary) hypertension: Secondary | ICD-10-CM | POA: Diagnosis not present

## 2016-10-31 DIAGNOSIS — H26491 Other secondary cataract, right eye: Secondary | ICD-10-CM | POA: Diagnosis not present

## 2016-10-31 DIAGNOSIS — H52223 Regular astigmatism, bilateral: Secondary | ICD-10-CM | POA: Diagnosis not present

## 2016-10-31 DIAGNOSIS — H43311 Vitreous membranes and strands, right eye: Secondary | ICD-10-CM | POA: Diagnosis not present

## 2016-11-21 DIAGNOSIS — S81811A Laceration without foreign body, right lower leg, initial encounter: Secondary | ICD-10-CM | POA: Diagnosis not present

## 2016-12-10 DIAGNOSIS — H0288A Meibomian gland dysfunction right eye, upper and lower eyelids: Secondary | ICD-10-CM | POA: Diagnosis not present

## 2016-12-10 DIAGNOSIS — H0288B Meibomian gland dysfunction left eye, upper and lower eyelids: Secondary | ICD-10-CM | POA: Diagnosis not present

## 2016-12-14 DIAGNOSIS — Z23 Encounter for immunization: Secondary | ICD-10-CM | POA: Diagnosis not present

## 2016-12-18 DIAGNOSIS — L728 Other follicular cysts of the skin and subcutaneous tissue: Secondary | ICD-10-CM | POA: Diagnosis not present

## 2016-12-18 DIAGNOSIS — L821 Other seborrheic keratosis: Secondary | ICD-10-CM | POA: Diagnosis not present

## 2016-12-18 DIAGNOSIS — L814 Other melanin hyperpigmentation: Secondary | ICD-10-CM | POA: Diagnosis not present

## 2016-12-18 DIAGNOSIS — L820 Inflamed seborrheic keratosis: Secondary | ICD-10-CM | POA: Diagnosis not present

## 2017-02-05 DIAGNOSIS — Z9841 Cataract extraction status, right eye: Secondary | ICD-10-CM | POA: Diagnosis not present

## 2017-02-05 DIAGNOSIS — H26491 Other secondary cataract, right eye: Secondary | ICD-10-CM | POA: Diagnosis not present

## 2017-02-05 DIAGNOSIS — Z961 Presence of intraocular lens: Secondary | ICD-10-CM | POA: Diagnosis not present

## 2017-03-22 DIAGNOSIS — M7061 Trochanteric bursitis, right hip: Secondary | ICD-10-CM | POA: Diagnosis not present

## 2017-03-25 DIAGNOSIS — M25551 Pain in right hip: Secondary | ICD-10-CM | POA: Diagnosis not present

## 2017-03-25 DIAGNOSIS — M6281 Muscle weakness (generalized): Secondary | ICD-10-CM | POA: Diagnosis not present

## 2017-03-25 DIAGNOSIS — M7061 Trochanteric bursitis, right hip: Secondary | ICD-10-CM | POA: Diagnosis not present

## 2017-03-25 DIAGNOSIS — R2689 Other abnormalities of gait and mobility: Secondary | ICD-10-CM | POA: Diagnosis not present

## 2017-04-01 DIAGNOSIS — M7061 Trochanteric bursitis, right hip: Secondary | ICD-10-CM | POA: Diagnosis not present

## 2017-04-01 DIAGNOSIS — M25551 Pain in right hip: Secondary | ICD-10-CM | POA: Diagnosis not present

## 2017-04-01 DIAGNOSIS — M6281 Muscle weakness (generalized): Secondary | ICD-10-CM | POA: Diagnosis not present

## 2017-04-01 DIAGNOSIS — R2689 Other abnormalities of gait and mobility: Secondary | ICD-10-CM | POA: Diagnosis not present

## 2017-04-03 DIAGNOSIS — M7061 Trochanteric bursitis, right hip: Secondary | ICD-10-CM | POA: Diagnosis not present

## 2017-04-03 DIAGNOSIS — M6281 Muscle weakness (generalized): Secondary | ICD-10-CM | POA: Diagnosis not present

## 2017-04-03 DIAGNOSIS — R2689 Other abnormalities of gait and mobility: Secondary | ICD-10-CM | POA: Diagnosis not present

## 2017-04-03 DIAGNOSIS — M25551 Pain in right hip: Secondary | ICD-10-CM | POA: Diagnosis not present

## 2017-04-10 DIAGNOSIS — M6281 Muscle weakness (generalized): Secondary | ICD-10-CM | POA: Diagnosis not present

## 2017-04-10 DIAGNOSIS — M25551 Pain in right hip: Secondary | ICD-10-CM | POA: Diagnosis not present

## 2017-04-10 DIAGNOSIS — M7061 Trochanteric bursitis, right hip: Secondary | ICD-10-CM | POA: Diagnosis not present

## 2017-04-10 DIAGNOSIS — R2689 Other abnormalities of gait and mobility: Secondary | ICD-10-CM | POA: Diagnosis not present

## 2017-04-17 DIAGNOSIS — M6281 Muscle weakness (generalized): Secondary | ICD-10-CM | POA: Diagnosis not present

## 2017-04-17 DIAGNOSIS — M7061 Trochanteric bursitis, right hip: Secondary | ICD-10-CM | POA: Diagnosis not present

## 2017-04-17 DIAGNOSIS — M25551 Pain in right hip: Secondary | ICD-10-CM | POA: Diagnosis not present

## 2017-04-17 DIAGNOSIS — R2689 Other abnormalities of gait and mobility: Secondary | ICD-10-CM | POA: Diagnosis not present

## 2017-04-23 DIAGNOSIS — E785 Hyperlipidemia, unspecified: Secondary | ICD-10-CM | POA: Diagnosis not present

## 2017-04-23 DIAGNOSIS — K219 Gastro-esophageal reflux disease without esophagitis: Secondary | ICD-10-CM | POA: Diagnosis not present

## 2017-04-23 DIAGNOSIS — M8589 Other specified disorders of bone density and structure, multiple sites: Secondary | ICD-10-CM | POA: Diagnosis not present

## 2017-04-23 DIAGNOSIS — R7301 Impaired fasting glucose: Secondary | ICD-10-CM | POA: Diagnosis not present

## 2017-04-23 DIAGNOSIS — I10 Essential (primary) hypertension: Secondary | ICD-10-CM | POA: Diagnosis not present

## 2017-04-23 DIAGNOSIS — Z6823 Body mass index (BMI) 23.0-23.9, adult: Secondary | ICD-10-CM | POA: Diagnosis not present

## 2017-04-23 DIAGNOSIS — Z1231 Encounter for screening mammogram for malignant neoplasm of breast: Secondary | ICD-10-CM | POA: Diagnosis not present

## 2017-04-25 DIAGNOSIS — M7061 Trochanteric bursitis, right hip: Secondary | ICD-10-CM | POA: Diagnosis not present

## 2017-04-25 DIAGNOSIS — R2689 Other abnormalities of gait and mobility: Secondary | ICD-10-CM | POA: Diagnosis not present

## 2017-04-25 DIAGNOSIS — M6281 Muscle weakness (generalized): Secondary | ICD-10-CM | POA: Diagnosis not present

## 2017-04-25 DIAGNOSIS — M25551 Pain in right hip: Secondary | ICD-10-CM | POA: Diagnosis not present

## 2017-05-03 DIAGNOSIS — M7061 Trochanteric bursitis, right hip: Secondary | ICD-10-CM | POA: Diagnosis not present

## 2017-05-15 DIAGNOSIS — L300 Nummular dermatitis: Secondary | ICD-10-CM | POA: Diagnosis not present

## 2017-05-15 DIAGNOSIS — L299 Pruritus, unspecified: Secondary | ICD-10-CM | POA: Diagnosis not present

## 2017-05-16 DIAGNOSIS — Z1231 Encounter for screening mammogram for malignant neoplasm of breast: Secondary | ICD-10-CM | POA: Diagnosis not present

## 2017-05-16 DIAGNOSIS — S62502A Fracture of unspecified phalanx of left thumb, initial encounter for closed fracture: Secondary | ICD-10-CM | POA: Diagnosis not present

## 2017-05-16 DIAGNOSIS — M25532 Pain in left wrist: Secondary | ICD-10-CM | POA: Diagnosis not present

## 2017-05-16 DIAGNOSIS — S63502A Unspecified sprain of left wrist, initial encounter: Secondary | ICD-10-CM | POA: Diagnosis not present

## 2017-05-25 DIAGNOSIS — J0100 Acute maxillary sinusitis, unspecified: Secondary | ICD-10-CM | POA: Diagnosis not present

## 2017-05-28 DIAGNOSIS — J040 Acute laryngitis: Secondary | ICD-10-CM | POA: Diagnosis not present

## 2017-05-28 DIAGNOSIS — Z6822 Body mass index (BMI) 22.0-22.9, adult: Secondary | ICD-10-CM | POA: Diagnosis not present

## 2017-05-28 DIAGNOSIS — J208 Acute bronchitis due to other specified organisms: Secondary | ICD-10-CM | POA: Diagnosis not present

## 2017-06-03 DIAGNOSIS — J040 Acute laryngitis: Secondary | ICD-10-CM | POA: Diagnosis not present

## 2017-06-03 DIAGNOSIS — J208 Acute bronchitis due to other specified organisms: Secondary | ICD-10-CM | POA: Diagnosis not present

## 2017-07-09 DIAGNOSIS — Z01419 Encounter for gynecological examination (general) (routine) without abnormal findings: Secondary | ICD-10-CM | POA: Diagnosis not present

## 2017-09-07 DIAGNOSIS — S80812A Abrasion, left lower leg, initial encounter: Secondary | ICD-10-CM | POA: Diagnosis not present

## 2017-10-01 DIAGNOSIS — Z9181 History of falling: Secondary | ICD-10-CM | POA: Diagnosis not present

## 2017-10-01 DIAGNOSIS — Z0000 Encounter for general adult medical examination without abnormal findings: Secondary | ICD-10-CM | POA: Diagnosis not present

## 2017-10-01 DIAGNOSIS — Z1231 Encounter for screening mammogram for malignant neoplasm of breast: Secondary | ICD-10-CM | POA: Diagnosis not present

## 2017-10-01 DIAGNOSIS — E785 Hyperlipidemia, unspecified: Secondary | ICD-10-CM | POA: Diagnosis not present

## 2017-10-01 DIAGNOSIS — Z1339 Encounter for screening examination for other mental health and behavioral disorders: Secondary | ICD-10-CM | POA: Diagnosis not present

## 2017-10-01 DIAGNOSIS — Z1331 Encounter for screening for depression: Secondary | ICD-10-CM | POA: Diagnosis not present

## 2017-10-09 DIAGNOSIS — L300 Nummular dermatitis: Secondary | ICD-10-CM | POA: Diagnosis not present

## 2017-10-09 DIAGNOSIS — R233 Spontaneous ecchymoses: Secondary | ICD-10-CM | POA: Diagnosis not present

## 2017-10-09 DIAGNOSIS — L299 Pruritus, unspecified: Secondary | ICD-10-CM | POA: Diagnosis not present

## 2017-10-29 DIAGNOSIS — Z1331 Encounter for screening for depression: Secondary | ICD-10-CM | POA: Diagnosis not present

## 2017-10-29 DIAGNOSIS — E785 Hyperlipidemia, unspecified: Secondary | ICD-10-CM | POA: Diagnosis not present

## 2017-10-29 DIAGNOSIS — M8589 Other specified disorders of bone density and structure, multiple sites: Secondary | ICD-10-CM | POA: Diagnosis not present

## 2017-10-29 DIAGNOSIS — Z139 Encounter for screening, unspecified: Secondary | ICD-10-CM | POA: Diagnosis not present

## 2017-10-29 DIAGNOSIS — R7301 Impaired fasting glucose: Secondary | ICD-10-CM | POA: Diagnosis not present

## 2017-10-29 DIAGNOSIS — I10 Essential (primary) hypertension: Secondary | ICD-10-CM | POA: Diagnosis not present

## 2017-10-29 DIAGNOSIS — Z9181 History of falling: Secondary | ICD-10-CM | POA: Diagnosis not present

## 2017-10-29 DIAGNOSIS — Z1339 Encounter for screening examination for other mental health and behavioral disorders: Secondary | ICD-10-CM | POA: Diagnosis not present

## 2017-10-29 DIAGNOSIS — K219 Gastro-esophageal reflux disease without esophagitis: Secondary | ICD-10-CM | POA: Diagnosis not present

## 2017-11-07 DIAGNOSIS — L300 Nummular dermatitis: Secondary | ICD-10-CM | POA: Diagnosis not present

## 2017-11-07 DIAGNOSIS — B86 Scabies: Secondary | ICD-10-CM | POA: Diagnosis not present

## 2017-11-07 DIAGNOSIS — L820 Inflamed seborrheic keratosis: Secondary | ICD-10-CM | POA: Diagnosis not present

## 2017-12-24 DIAGNOSIS — B86 Scabies: Secondary | ICD-10-CM | POA: Diagnosis not present

## 2017-12-24 DIAGNOSIS — L299 Pruritus, unspecified: Secondary | ICD-10-CM | POA: Diagnosis not present

## 2017-12-24 DIAGNOSIS — L853 Xerosis cutis: Secondary | ICD-10-CM | POA: Diagnosis not present

## 2018-01-10 DIAGNOSIS — Z23 Encounter for immunization: Secondary | ICD-10-CM | POA: Diagnosis not present

## 2018-01-28 DIAGNOSIS — H35373 Puckering of macula, bilateral: Secondary | ICD-10-CM | POA: Diagnosis not present

## 2018-01-28 DIAGNOSIS — H524 Presbyopia: Secondary | ICD-10-CM | POA: Diagnosis not present

## 2018-01-28 DIAGNOSIS — H35341 Macular cyst, hole, or pseudohole, right eye: Secondary | ICD-10-CM | POA: Diagnosis not present

## 2018-01-28 DIAGNOSIS — H5203 Hypermetropia, bilateral: Secondary | ICD-10-CM | POA: Diagnosis not present

## 2018-01-28 DIAGNOSIS — H52223 Regular astigmatism, bilateral: Secondary | ICD-10-CM | POA: Diagnosis not present

## 2018-01-28 DIAGNOSIS — I10 Essential (primary) hypertension: Secondary | ICD-10-CM | POA: Diagnosis not present

## 2018-01-28 DIAGNOSIS — Z9842 Cataract extraction status, left eye: Secondary | ICD-10-CM | POA: Diagnosis not present

## 2018-01-28 DIAGNOSIS — Z961 Presence of intraocular lens: Secondary | ICD-10-CM | POA: Diagnosis not present

## 2018-01-28 DIAGNOSIS — Z9841 Cataract extraction status, right eye: Secondary | ICD-10-CM | POA: Diagnosis not present

## 2018-04-30 DIAGNOSIS — K219 Gastro-esophageal reflux disease without esophagitis: Secondary | ICD-10-CM | POA: Diagnosis not present

## 2018-04-30 DIAGNOSIS — M8589 Other specified disorders of bone density and structure, multiple sites: Secondary | ICD-10-CM | POA: Diagnosis not present

## 2018-04-30 DIAGNOSIS — R7301 Impaired fasting glucose: Secondary | ICD-10-CM | POA: Diagnosis not present

## 2018-04-30 DIAGNOSIS — Z1231 Encounter for screening mammogram for malignant neoplasm of breast: Secondary | ICD-10-CM | POA: Diagnosis not present

## 2018-04-30 DIAGNOSIS — E785 Hyperlipidemia, unspecified: Secondary | ICD-10-CM | POA: Diagnosis not present

## 2018-04-30 DIAGNOSIS — I10 Essential (primary) hypertension: Secondary | ICD-10-CM | POA: Diagnosis not present

## 2018-05-04 DIAGNOSIS — S60811A Abrasion of right wrist, initial encounter: Secondary | ICD-10-CM | POA: Diagnosis not present

## 2018-05-04 DIAGNOSIS — S63502A Unspecified sprain of left wrist, initial encounter: Secondary | ICD-10-CM | POA: Diagnosis not present

## 2018-05-10 DIAGNOSIS — M25552 Pain in left hip: Secondary | ICD-10-CM | POA: Diagnosis not present

## 2018-05-12 DIAGNOSIS — M25552 Pain in left hip: Secondary | ICD-10-CM | POA: Diagnosis not present

## 2018-05-12 DIAGNOSIS — S7002XA Contusion of left hip, initial encounter: Secondary | ICD-10-CM | POA: Diagnosis not present

## 2018-05-13 DIAGNOSIS — M25552 Pain in left hip: Secondary | ICD-10-CM | POA: Diagnosis not present

## 2018-05-13 DIAGNOSIS — R2689 Other abnormalities of gait and mobility: Secondary | ICD-10-CM | POA: Diagnosis not present

## 2018-05-16 DIAGNOSIS — R2689 Other abnormalities of gait and mobility: Secondary | ICD-10-CM | POA: Diagnosis not present

## 2018-05-16 DIAGNOSIS — M25552 Pain in left hip: Secondary | ICD-10-CM | POA: Diagnosis not present

## 2018-05-20 DIAGNOSIS — R2689 Other abnormalities of gait and mobility: Secondary | ICD-10-CM | POA: Diagnosis not present

## 2018-05-20 DIAGNOSIS — M25552 Pain in left hip: Secondary | ICD-10-CM | POA: Diagnosis not present

## 2018-05-22 DIAGNOSIS — M25552 Pain in left hip: Secondary | ICD-10-CM | POA: Diagnosis not present

## 2018-05-22 DIAGNOSIS — R2689 Other abnormalities of gait and mobility: Secondary | ICD-10-CM | POA: Diagnosis not present

## 2018-05-27 DIAGNOSIS — M25552 Pain in left hip: Secondary | ICD-10-CM | POA: Diagnosis not present

## 2018-05-27 DIAGNOSIS — R2689 Other abnormalities of gait and mobility: Secondary | ICD-10-CM | POA: Diagnosis not present

## 2018-05-29 DIAGNOSIS — M25552 Pain in left hip: Secondary | ICD-10-CM | POA: Diagnosis not present

## 2018-05-29 DIAGNOSIS — R2689 Other abnormalities of gait and mobility: Secondary | ICD-10-CM | POA: Diagnosis not present

## 2018-06-05 DIAGNOSIS — M25552 Pain in left hip: Secondary | ICD-10-CM | POA: Diagnosis not present

## 2018-06-05 DIAGNOSIS — S7002XA Contusion of left hip, initial encounter: Secondary | ICD-10-CM | POA: Diagnosis not present

## 2018-06-05 DIAGNOSIS — R2689 Other abnormalities of gait and mobility: Secondary | ICD-10-CM | POA: Diagnosis not present

## 2018-06-09 DIAGNOSIS — M25552 Pain in left hip: Secondary | ICD-10-CM | POA: Diagnosis not present

## 2018-07-30 DIAGNOSIS — R233 Spontaneous ecchymoses: Secondary | ICD-10-CM | POA: Diagnosis not present

## 2018-07-30 DIAGNOSIS — L299 Pruritus, unspecified: Secondary | ICD-10-CM | POA: Diagnosis not present

## 2018-07-30 DIAGNOSIS — L300 Nummular dermatitis: Secondary | ICD-10-CM | POA: Diagnosis not present

## 2018-08-14 DIAGNOSIS — R233 Spontaneous ecchymoses: Secondary | ICD-10-CM | POA: Diagnosis not present

## 2018-08-14 DIAGNOSIS — R21 Rash and other nonspecific skin eruption: Secondary | ICD-10-CM | POA: Diagnosis not present

## 2018-09-04 DIAGNOSIS — I8311 Varicose veins of right lower extremity with inflammation: Secondary | ICD-10-CM | POA: Diagnosis not present

## 2018-09-04 DIAGNOSIS — I8312 Varicose veins of left lower extremity with inflammation: Secondary | ICD-10-CM | POA: Diagnosis not present

## 2018-09-04 DIAGNOSIS — I83893 Varicose veins of bilateral lower extremities with other complications: Secondary | ICD-10-CM | POA: Diagnosis not present

## 2018-09-04 DIAGNOSIS — I83813 Varicose veins of bilateral lower extremities with pain: Secondary | ICD-10-CM | POA: Diagnosis not present

## 2018-09-11 DIAGNOSIS — Z01419 Encounter for gynecological examination (general) (routine) without abnormal findings: Secondary | ICD-10-CM | POA: Diagnosis not present

## 2018-09-16 DIAGNOSIS — I87393 Chronic venous hypertension (idiopathic) with other complications of bilateral lower extremity: Secondary | ICD-10-CM | POA: Diagnosis not present

## 2018-09-22 DIAGNOSIS — Z1231 Encounter for screening mammogram for malignant neoplasm of breast: Secondary | ICD-10-CM | POA: Diagnosis not present

## 2018-10-06 DIAGNOSIS — E785 Hyperlipidemia, unspecified: Secondary | ICD-10-CM | POA: Diagnosis not present

## 2018-10-06 DIAGNOSIS — Z1331 Encounter for screening for depression: Secondary | ICD-10-CM | POA: Diagnosis not present

## 2018-10-06 DIAGNOSIS — Z0000 Encounter for general adult medical examination without abnormal findings: Secondary | ICD-10-CM | POA: Diagnosis not present

## 2018-10-06 DIAGNOSIS — N959 Unspecified menopausal and perimenopausal disorder: Secondary | ICD-10-CM | POA: Diagnosis not present

## 2018-10-06 DIAGNOSIS — Z9181 History of falling: Secondary | ICD-10-CM | POA: Diagnosis not present

## 2018-10-30 DIAGNOSIS — M25551 Pain in right hip: Secondary | ICD-10-CM | POA: Diagnosis not present

## 2018-10-31 DIAGNOSIS — I10 Essential (primary) hypertension: Secondary | ICD-10-CM | POA: Diagnosis not present

## 2018-10-31 DIAGNOSIS — K449 Diaphragmatic hernia without obstruction or gangrene: Secondary | ICD-10-CM | POA: Diagnosis not present

## 2018-10-31 DIAGNOSIS — E785 Hyperlipidemia, unspecified: Secondary | ICD-10-CM | POA: Diagnosis not present

## 2018-10-31 DIAGNOSIS — K219 Gastro-esophageal reflux disease without esophagitis: Secondary | ICD-10-CM | POA: Diagnosis not present

## 2018-10-31 DIAGNOSIS — M8589 Other specified disorders of bone density and structure, multiple sites: Secondary | ICD-10-CM | POA: Diagnosis not present

## 2018-10-31 DIAGNOSIS — R7301 Impaired fasting glucose: Secondary | ICD-10-CM | POA: Diagnosis not present

## 2018-11-06 ENCOUNTER — Telehealth: Payer: Self-pay | Admitting: Orthopaedic Surgery

## 2018-11-06 NOTE — Telephone Encounter (Signed)
Called patient left message to return call to schedule an appointment for her back per patient request

## 2018-11-11 ENCOUNTER — Ambulatory Visit (INDEPENDENT_AMBULATORY_CARE_PROVIDER_SITE_OTHER): Payer: Medicare PPO | Admitting: Orthopaedic Surgery

## 2018-11-11 ENCOUNTER — Ambulatory Visit (INDEPENDENT_AMBULATORY_CARE_PROVIDER_SITE_OTHER): Payer: Medicare PPO

## 2018-11-11 ENCOUNTER — Encounter: Payer: Self-pay | Admitting: Orthopaedic Surgery

## 2018-11-11 DIAGNOSIS — M545 Low back pain, unspecified: Secondary | ICD-10-CM

## 2018-11-11 DIAGNOSIS — S39012A Strain of muscle, fascia and tendon of lower back, initial encounter: Secondary | ICD-10-CM | POA: Diagnosis not present

## 2018-11-11 MED ORDER — METHOCARBAMOL 500 MG PO TABS: 500.0000 mg | ORAL_TABLET | Freq: Two times a day (BID) | ORAL | 0 refills | Status: AC | PRN

## 2018-11-11 MED ORDER — PREDNISONE 5 MG (21) PO TBPK: ORAL_TABLET | ORAL | 0 refills | Status: AC

## 2018-11-11 NOTE — Progress Notes (Signed)
Office Visit Note   Patient: Laura Romero           Date of Birth: 26-Feb-1940           MRN: [id number] Visit Date: 11/11/2018              Requested by: Nicoletta Dress, MD [place],  [place] [address] PCP: Nicoletta Dress, MD   Assessment & Plan: Visit Diagnoses:  1. Low back pain, unspecified back pain laterality, unspecified chronicity, unspecified whether sciatica present   2. Strain of lumbar region, initial encounter     Plan: Impression is right-sided lumbar strain.  We will start the patient on a small Sterapred taper and muscle relaxer.  I did discuss physical therapy but she would like to try medications for now.  She will follow-up with [place] as needed.  Follow-Up Instructions: Return if symptoms worsen or fail to improve.   Orders:  Orders Placed This Encounter  Procedures   XR Lumbar Spine 2-3 Views   Meds ordered this encounter  Medications   methocarbamol (ROBAXIN) 500 MG tablet    Sig: Take 1 tablet (500 mg total) by mouth 2 (two) times daily as needed.    Dispense:  30 tablet    Refill:  0   predniSONE (STERAPRED UNI-PAK 21 TAB) 5 MG (21) TBPK tablet    Sig: Take as directed    Dispense:  21 tablet    Refill:  0      Procedures: No procedures performed   Clinical Data: No additional findings.   Subjective: Chief Complaint  Patient presents with   Lower Back - Pain    HPI patient is a pleasant 78 year old female who presents our clinic today with right-sided lower back and lateral hip pain.  This began approximately 2 weeks ago when she was reaching up to put something in the cabinet.  She felt as though she pulled a muscle.  The pain she has slightly radiates into the groin.  No anterior thigh or posterior leg pain.  She has been using heating pad naproxen without significant relief of symptoms.  No numbness, tingling or burning.  Of note she is status post L4-5 lateral recess decompression with  microdiscectomy on the left by Dr. Junius Roads for years ago.  Doing well until this recent event.  She currently denies any bowel or bladder change and no saddle paresthesias.  Review of Systems as detailed in HPI.  All others reviewed and are negative.   Objective: Vital Signs: There were no vitals taken for this visit.  Physical Exam well-developed and well-nourished female in no acute distress.  Alert and oriented x3.  Ortho Exam  Specialty Comments:  No specialty comments available.  Imaging: No results found.   PMFS History: Patient Active Problem List   Diagnosis Date Noted   S/P lumbar discectomy 12/03/2014   Past Medical History:  Diagnosis Date   Esophageal stricture    GERD (gastroesophageal reflux disease)    History of left bundle branch block (LBBB)    Hypertension    Status post dilatation of esophageal stricture     History reviewed. No pertinent family history.  Past Surgical History:  Procedure Laterality Date   BACK SURGERY  1975   lower back surgery   BREAST LUMPECTOMY Left    CATARACT EXTRACTION Bilateral    CHOLECYSTECTOMY     ESOPHAGEAL DILATION     LUMBAR LAMINECTOMY/DECOMPRESSION MICRODISCECTOMY Left 12/03/2014  Procedure: Left Re-exploration L4-5 Lateral Recess Decompression, Microdiscectomy;  Surgeon: Marybelle Killings, MD;  Location: [facility];  Service: Orthopedics;  Laterality: Left;   Social History   Occupational History   Not on file  Tobacco Use   Smoking status: Never Smoker   Smokeless tobacco: Never Used  Substance and Sexual Activity   Alcohol use: No   Drug use: No   Sexual activity: Not on file

## 2018-11-21 DIAGNOSIS — M5136 Other intervertebral disc degeneration, lumbar region: Secondary | ICD-10-CM | POA: Diagnosis not present

## 2018-11-26 DIAGNOSIS — M545 Low back pain: Secondary | ICD-10-CM | POA: Diagnosis not present

## 2018-11-26 DIAGNOSIS — M5136 Other intervertebral disc degeneration, lumbar region: Secondary | ICD-10-CM | POA: Diagnosis not present

## 2018-12-02 ENCOUNTER — Telehealth: Payer: Self-pay | Admitting: Orthopaedic Surgery

## 2018-12-02 NOTE — Telephone Encounter (Signed)
Yes please put in order for another [facility]

## 2018-12-02 NOTE — Telephone Encounter (Signed)
Patient having back pain again and wanted to get another injection  from Dr.Newton. Can she get injection?

## 2018-12-02 NOTE — Telephone Encounter (Signed)
Please advise 

## 2018-12-02 NOTE — Telephone Encounter (Signed)
Patient returned call asked for a call back to schedule an appointment with Dr Lorin Mercy for her back. The number to contact patient is [phone]

## 2018-12-03 ENCOUNTER — Other Ambulatory Visit: Payer: Self-pay

## 2018-12-03 DIAGNOSIS — M545 Low back pain, unspecified: Secondary | ICD-10-CM

## 2018-12-03 NOTE — Telephone Encounter (Signed)
ESI ORDER  MADE PATIENT AWARE

## 2018-12-03 NOTE — Telephone Encounter (Signed)
Patient had also called and requested repeat ESI from Dr. Erlinda Hong.  Per other message, Dr. Erlinda Hong will refer her for [facility]. If patient would still like to see Dr. Lorin Mercy, we can make her appointment follow up post ESI when he returns to office.   Thanks.

## 2018-12-03 NOTE — Telephone Encounter (Signed)
ORDER MADE 

## 2018-12-29 ENCOUNTER — Encounter: Payer: Self-pay | Admitting: Physical Medicine and Rehabilitation

## 2018-12-29 ENCOUNTER — Other Ambulatory Visit: Payer: Self-pay

## 2018-12-29 ENCOUNTER — Ambulatory Visit: Payer: Self-pay

## 2018-12-29 ENCOUNTER — Ambulatory Visit: Payer: Medicare PPO | Admitting: Physical Medicine and Rehabilitation

## 2018-12-29 VITALS — BP 187/83 | HR 59

## 2018-12-29 DIAGNOSIS — M5416 Radiculopathy, lumbar region: Secondary | ICD-10-CM

## 2018-12-29 DIAGNOSIS — M5116 Intervertebral disc disorders with radiculopathy, lumbar region: Secondary | ICD-10-CM

## 2018-12-29 DIAGNOSIS — M48062 Spinal stenosis, lumbar region with neurogenic claudication: Secondary | ICD-10-CM

## 2018-12-29 MED ORDER — BETAMETHASONE SOD PHOS & ACET 6 (3-3) MG/ML IJ SUSP
12.0000 mg | Freq: Once | INTRAMUSCULAR | Status: AC
  Administered 2018-12-29: 12 mg

## 2018-12-29 NOTE — Progress Notes (Signed)
 .  Numeric Pain Rating Scale and Functional Assessment Average Pain 5   In the last MONTH (on 0-10 scale) has pain interfered with the following?  1. General activity like being  able to carry out your everyday physical activities such as walking, climbing stairs, carrying groceries, or moving a chair?  Rating(6)   +Driver, -BT, -Dye Allergies.  

## 2019-01-09 DIAGNOSIS — Z23 Encounter for immunization: Secondary | ICD-10-CM | POA: Diagnosis not present

## 2019-01-12 ENCOUNTER — Telehealth: Payer: Self-pay | Admitting: Physical Medicine and Rehabilitation

## 2019-01-12 NOTE — Telephone Encounter (Signed)
Left L4 and L5 tf esi

## 2019-01-13 NOTE — Telephone Encounter (Signed)
Left message #1

## 2019-01-14 NOTE — Telephone Encounter (Signed)
Needs auth for [phone] and [phone]. Scheduled for 12/17.

## 2019-01-14 NOTE — Telephone Encounter (Signed)
[id number] Auth No / Request ID  Status Auto-Approved Decision Approved Effective Date 01/14/2019 Expiration Date 02/28/2019

## 2019-02-05 ENCOUNTER — Ambulatory Visit: Payer: Self-pay

## 2019-02-05 ENCOUNTER — Encounter: Payer: Self-pay | Admitting: Physical Medicine and Rehabilitation

## 2019-02-05 ENCOUNTER — Ambulatory Visit: Payer: Medicare PPO | Admitting: Physical Medicine and Rehabilitation

## 2019-02-05 ENCOUNTER — Other Ambulatory Visit: Payer: Self-pay

## 2019-02-05 VITALS — BP 190/70 | HR 56

## 2019-02-05 DIAGNOSIS — M961 Postlaminectomy syndrome, not elsewhere classified: Secondary | ICD-10-CM

## 2019-02-05 DIAGNOSIS — M48062 Spinal stenosis, lumbar region with neurogenic claudication: Secondary | ICD-10-CM | POA: Diagnosis not present

## 2019-02-05 DIAGNOSIS — M5416 Radiculopathy, lumbar region: Secondary | ICD-10-CM

## 2019-02-05 MED ORDER — BETAMETHASONE SOD PHOS & ACET 6 (3-3) MG/ML IJ SUSP
12.0000 mg | Freq: Once | INTRAMUSCULAR | Status: AC
  Administered 2019-02-05: 11:00:00 12 mg

## 2019-02-05 NOTE — Progress Notes (Signed)
 .  Numeric Pain Rating Scale and Functional Assessment Average Pain 8   In the last MONTH (on 0-10 scale) has pain interfered with the following?  1. General activity like being  able to carry out your everyday physical activities such as walking, climbing stairs, carrying groceries, or moving a chair?  Rating(8)   +Driver, -BT, -Dye Allergies.  

## 2019-02-05 NOTE — Progress Notes (Signed)
Laura Romero - 78 y.o. female MRN [id number]  Date of birth: 1940-08-09  Office Visit Note: Visit Date: 02/05/2019 PCP: Nicoletta Dress, MD Referred by: Nicoletta Dress, MD  Subjective: Chief Complaint  Patient presents with  . Lower Back - Pain  . Left Leg - Pain   HPI:  Laura Romero is a 78 y.o. female who comes in today For planned transforaminal epidural steroid injection.  Patient saw several weeks ago and we completed a right-sided L3 and L4 transforaminal epidural steroid injection.  Patient did well with injection quite a bit of sensitivity to really any needling but did well.  She actually got relief with the injection but today states that it seemed to help for a few weeks but in point of fact that was on the right side and now she is having left-sided radicular pain and a pretty classic L5 distribution.  She has had prior lumbar surgery L5-S1 with reexploration.  She has some arachnoiditis features.  She has stenosis at L3-4 and disc herniation which is newer.  We will complete a left L5 transforaminal injection today diagnostically and hopefully therapeutically.  She may need to follow-up with Dr. Rodell Perna who has done spine surgery with her in the past to see what the next step may be if she is not getting any longer term relief.  I do make it clear that other the last injection was on the right side and she was having right-sided pain when she was talking to Dr. Wyline Copas.  Right-sided pain is much better at this point.  She is continue take oxycodone.  ROS Otherwise per HPI.  Assessment & Plan: Visit Diagnoses:  1. Lumbar radiculopathy   2. Spinal stenosis of lumbar region with neurogenic claudication   3. Post laminectomy syndrome     Plan: No additional findings.   Meds & Orders:  Meds ordered this encounter  Medications  . betamethasone acetate-betamethasone sodium phosphate (CELESTONE) injection 12 mg    Orders Placed This Encounter  Procedures  . XR  C-ARM NO REPORT  . Epidural Steroid injection    Follow-up: No follow-ups on file.   Procedures: No procedures performed  Lumbosacral Transforaminal Epidural Steroid Injection - Sub-Pedicular Approach with Fluoroscopic Guidance  Patient: Laura Romero      Date of Birth: 10/10/1940 MRN: [id number] PCP: Nicoletta Dress, MD      Visit Date: 02/05/2019   Universal Protocol:    Date/Time: 02/05/2019  Consent Given By: the patient  Position: PRONE  Additional Comments: Vital signs were monitored before and after the procedure. Patient was prepped and draped in the usual sterile fashion. The correct patient, procedure, and site was verified.   Injection Procedure Details:  Procedure Site One Meds Administered:  Meds ordered this encounter  Medications  . betamethasone acetate-betamethasone sodium phosphate (CELESTONE) injection 12 mg    Laterality: Left  Location/Site:  L5-S1  Needle size: 22 G  Needle type: Spinal  Needle Placement: Transforaminal  Findings:    -Comments: Excellent flow of contrast along the nerve and into the epidural space.  Procedure Details: After squaring off the end-plates to get a true AP view, the C-arm was positioned so that an oblique view of the foramen as noted above was visualized. The target area is just inferior to the "nose of the scotty dog" or sub pedicular. The soft tissues overlying this structure were infiltrated with 2-3 ml. of 1% Lidocaine without Epinephrine.  The spinal needle  was inserted toward the target using a "trajectory" view along the fluoroscope beam.  Under AP and lateral visualization, the needle was advanced so it did not puncture dura and was located close the 6 O'Clock position of the pedical in AP tracterory. Biplanar projections were used to confirm position. Aspiration was confirmed to be negative for CSF and/or blood. A 1-2 ml. volume of Isovue-250 was injected and flow of contrast was noted at each  level. Radiographs were obtained for documentation purposes.   After attaining the desired flow of contrast documented above, a 0.5 to 1.0 ml test dose of 0.25% Marcaine was injected into each respective transforaminal space.  The patient was observed for 90 seconds post injection.  After no sensory deficits were reported, and normal lower extremity motor function was noted,   the above injectate was administered so that equal amounts of the injectate were placed at each foramen (level) into the transforaminal epidural space.   Additional Comments:  No complications occurred .  Patient did have reproductive pain at the L5 level just even getting anywhere near the foramen and this seemed to be really flared up.  Despite copious numbing medication still had quite a bit of issues trying to complete the injection.  We did get a good flow of contrast initially into the foramen. Dressing: 2 x 2 sterile gauze and Band-Aid    Post-procedure details: Patient was observed during the procedure. Post-procedure instructions were reviewed.  Patient left the clinic in stable condition.     Clinical History: MRI LUMBAR SPINE WITHOUT CONTRAST  TECHNIQUE: Multiplanar, multisequence MR imaging of the lumbar spine was performed. No intravenous contrast was administered.  COMPARISON: MRI lumbar spine 10/16/2014. Plain films lumbar spine 11/11/2018.  FINDINGS: Segmentation: Standard.  Alignment: There is mild convex left scoliosis. 0.3 cm anterolisthesis L4 on L5 due to facet arthropathy is new since the prior MRI.  Vertebrae: No fracture or worrisome lesion. A few small hemangiomas are noted. Small sclerotic lesion in L5 is unchanged and may be a bone island.  Conus medullaris and cauda equina: Conus extends to the L1-2 level. Conus and cauda equina appear normal.  Paraspinal and other soft tissues: Negative.  Disc levels:  T11-12 and T12-L1 are imaged in the sagittal plane only. Small  disc protrusions at both levels without central canal or foraminal narrowing are unchanged.  L1-2: Shallow disc bulge and mild facet degenerative disease without stenosis, unchanged.  L2-3: Ligamentum flavum thickening, shallow broad-based disc bulge and mild-to-moderate facet arthropathy. There is moderate central canal stenosis. The foramina are open. Spondylosis has progressed since the prior exam.  L3-4: Spondylosis has progressed. There is moderate to moderately severe facet degenerative change, a diffuse broad-based disc bulge and ligamentum flavum thickening. Moderately severe to severe central canal and bilateral subarticular recess narrowing are seen. Mild to moderate foraminal narrowing is worse on the right.  L4-5: Right laminotomy defect is identified as on the prior examination. There is a new broad-based protrusion with cephalad extension in the right subarticular recess and foramen. Disc impinges on both the exiting right L4 and descending right L5 roots. There is also marked left foraminal narrowing and impingement on the exiting left L4 root due to disc and facet arthropathy. Moderate central canal stenosis is present overall.  L5-S1: Nerve roots are peripherally adhesed in the thecal sac consistent with arachnoiditis, unchanged. Right laminectomy defect is identified. There is a broad-based protrusion with caudal extension encroaching on both descending S1 roots. Moderate to moderately severe  foraminal narrowing is worse on the left. Spondylosis has progressed since the prior examination.  IMPRESSION: Worsened spondylosis at L4-5 where the patient is status post right laminotomy. New broad-based protrusion with cephalad extension in the right subarticular recess and foramen impinges on both the right L4 and L5 roots. There is also marked left foraminal narrowing with encroachment on the left L4 root.  Progressive spondylosis at L5-S1 where the patient is  status post right laminotomy. A broad-based protrusion with caudal extension encroaches on both descending S1 roots. Findings consistent with arachnoiditis at this level are unchanged.  Moderately severe to severe central canal stenosis at L3-4 with narrowing in both subarticular recesses and impingement on the descending L4 roots is worse than on the prior examination. Mild to moderate foraminal narrowing at this level is worse on the right.  Moderate central canal stenosis at L2-3 is worse than on the prior MRI.   Electronically Signed  By: Inge Rise M.D.  On: 11/27/2018 08:49     Objective:  VS:  HT:    WT:   BMI:     BP:(!) 190/70  HR:(!) 56bpm  TEMP: ( )  RESP:  Physical Exam Musculoskeletal:     Comments: Patient ambulates with a cane and is slow to rise from seated position to full extension.     Ortho Exam Imaging: No results found.

## 2019-02-05 NOTE — Procedures (Signed)
Lumbosacral Transforaminal Epidural Steroid Injection - Sub-Pedicular Approach with Fluoroscopic Guidance  Patient: Laura Romero      Date of Birth: 14-Jul-1940 MRN: [id number] PCP: Nicoletta Dress, MD      Visit Date: 02/05/2019   Universal Protocol:    Date/Time: 02/05/2019  Consent Given By: the patient  Position: PRONE  Additional Comments: Vital signs were monitored before and after the procedure. Patient was prepped and draped in the usual sterile fashion. The correct patient, procedure, and site was verified.   Injection Procedure Details:  Procedure Site One Meds Administered:  Meds ordered this encounter  Medications  . betamethasone acetate-betamethasone sodium phosphate (CELESTONE) injection 12 mg    Laterality: Left  Location/Site:  L5-S1  Needle size: 22 G  Needle type: Spinal  Needle Placement: Transforaminal  Findings:    -Comments: Excellent flow of contrast along the nerve and into the epidural space.  Procedure Details: After squaring off the end-plates to get a true AP view, the C-arm was positioned so that an oblique view of the foramen as noted above was visualized. The target area is just inferior to the "nose of the scotty dog" or sub pedicular. The soft tissues overlying this structure were infiltrated with 2-3 ml. of 1% Lidocaine without Epinephrine.  The spinal needle was inserted toward the target using a "trajectory" view along the fluoroscope beam.  Under AP and lateral visualization, the needle was advanced so it did not puncture dura and was located close the 6 O'Clock position of the pedical in AP tracterory. Biplanar projections were used to confirm position. Aspiration was confirmed to be negative for CSF and/or blood. A 1-2 ml. volume of Isovue-250 was injected and flow of contrast was noted at each level. Radiographs were obtained for documentation purposes.   After attaining the desired flow of contrast documented above, a 0.5  to 1.0 ml test dose of 0.25% Marcaine was injected into each respective transforaminal space.  The patient was observed for 90 seconds post injection.  After no sensory deficits were reported, and normal lower extremity motor function was noted,   the above injectate was administered so that equal amounts of the injectate were placed at each foramen (level) into the transforaminal epidural space.   Additional Comments:  No complications occurred .  Patient did have reproductive pain at the L5 level just even getting anywhere near the foramen and this seemed to be really flared up.  Despite copious numbing medication still had quite a bit of issues trying to complete the injection.  We did get a good flow of contrast initially into the foramen. Dressing: 2 x 2 sterile gauze and Band-Aid    Post-procedure details: Patient was observed during the procedure. Post-procedure instructions were reviewed.  Patient left the clinic in stable condition.

## 2019-02-05 NOTE — Procedures (Signed)
Lumbosacral Transforaminal Epidural Steroid Injection - Sub-Pedicular Approach with Fluoroscopic Guidance  Patient: Laura Romero      Date of Birth: [birth year] MRN: [id number] PCP: Nicoletta Dress, MD      Visit Date: 12/29/2018   Universal Protocol:    Date/Time: 12/29/2018  Consent Given By: the patient  Position: PRONE  Additional Comments: Vital signs were monitored before and after the procedure. Patient was prepped and draped in the usual sterile fashion. The correct patient, procedure, and site was verified.   Injection Procedure Details:  Procedure Site One Meds Administered:  Meds ordered this encounter  Medications  . betamethasone acetate-betamethasone sodium phosphate (CELESTONE) injection 12 mg    Laterality: Right  Location/Site:  L3-L4 L4-L5  Needle size: 22 G  Needle type: Spinal  Needle Placement: Transforaminal  Findings:    -Comments: Excellent flow of contrast along the nerve and into the epidural space.  Procedure Details: After squaring off the end-plates to get a true AP view, the C-arm was positioned so that an oblique view of the foramen as noted above was visualized. The target area is just inferior to the "nose of the scotty dog" or sub pedicular. The soft tissues overlying this structure were infiltrated with 2-3 ml. of 1% Lidocaine without Epinephrine.  The spinal needle was inserted toward the target using a "trajectory" view along the fluoroscope beam.  Under AP and lateral visualization, the needle was advanced so it did not puncture dura and was located close the 6 O'Clock position of the pedical in AP tracterory. Biplanar projections were used to confirm position. Aspiration was confirmed to be negative for CSF and/or blood. A 1-2 ml. volume of Isovue-250 was injected and flow of contrast was noted at each level. Radiographs were obtained for documentation purposes.   After attaining the desired flow of contrast documented  above, a 0.5 to 1.0 ml test dose of 0.25% Marcaine was injected into each respective transforaminal space.  The patient was observed for 90 seconds post injection.  After no sensory deficits were reported, and normal lower extremity motor function was noted,   the above injectate was administered so that equal amounts of the injectate were placed at each foramen (level) into the transforaminal epidural space.   Additional Comments:  The patient tolerated the procedure well Dressing: 2 x 2 sterile gauze and Band-Aid    Post-procedure details: Patient was observed during the procedure. Post-procedure instructions were reviewed.  Patient left the clinic in stable condition.

## 2019-02-05 NOTE — Progress Notes (Signed)
Laura Romero - 78 y.o. female MRN [id number]  Date of birth: 19-Mar-1940  Office Visit Note: Visit Date: 12/29/2018 PCP: Nicoletta Dress, MD Referred by: Nicoletta Dress, MD  Subjective: Chief Complaint  Patient presents with  . Lower Back - Pain  . Right Leg - Pain  . Left Leg - Pain   HPI:  Laura Romero is a 78 y.o. female who comes in today For planned right L3 and L4 transforaminal injection.  Patient been followed by Dr. Eduard Roux.  My showing multiple levels of issues with prior laminectomy and laminotomy.  She has recurrent disc extrusion at L4-5 with some impact of the L3 and L4 nerve roots and also L5 and S1 nerve roots.  Mostly right-sided hip and leg pain.  She is failed conservative care otherwise this is been ongoing for a while causing her quite severe pain.  ROS Otherwise per HPI.  Assessment & Plan: Visit Diagnoses:  1. Lumbar radiculopathy   2. Spinal stenosis of lumbar region with neurogenic claudication   3. Radiculopathy due to lumbar intervertebral disc disorder     Plan: No additional findings.   Meds & Orders:  Meds ordered this encounter  Medications  . betamethasone acetate-betamethasone sodium phosphate (CELESTONE) injection 12 mg    Orders Placed This Encounter  Procedures  . XR C-ARM NO REPORT  . Epidural Steroid injection    Follow-up: Return if symptoms worsen or fail to improve, for Consider right L4 and L5 transforaminal injection.   Procedures: No procedures performed  Lumbosacral Transforaminal Epidural Steroid Injection - Sub-Pedicular Approach with Fluoroscopic Guidance  Patient: Laura Romero      Date of Birth: [birth year] MRN: [id number] PCP: Nicoletta Dress, MD      Visit Date: 12/29/2018   Universal Protocol:    Date/Time: 12/29/2018  Consent Given By: the patient  Position: PRONE  Additional Comments: Vital signs were monitored before and after the procedure. Patient was prepped and draped in the  usual sterile fashion. The correct patient, procedure, and site was verified.   Injection Procedure Details:  Procedure Site One Meds Administered:  Meds ordered this encounter  Medications  . betamethasone acetate-betamethasone sodium phosphate (CELESTONE) injection 12 mg    Laterality: Right  Location/Site:  L3-L4 L4-L5  Needle size: 22 G  Needle type: Spinal  Needle Placement: Transforaminal  Findings:    -Comments: Excellent flow of contrast along the nerve and into the epidural space.  Procedure Details: After squaring off the end-plates to get a true AP view, the C-arm was positioned so that an oblique view of the foramen as noted above was visualized. The target area is just inferior to the "nose of the scotty dog" or sub pedicular. The soft tissues overlying this structure were infiltrated with 2-3 ml. of 1% Lidocaine without Epinephrine.  The spinal needle was inserted toward the target using a "trajectory" view along the fluoroscope beam.  Under AP and lateral visualization, the needle was advanced so it did not puncture dura and was located close the 6 O'Clock position of the pedical in AP tracterory. Biplanar projections were used to confirm position. Aspiration was confirmed to be negative for CSF and/or blood. A 1-2 ml. volume of Isovue-250 was injected and flow of contrast was noted at each level. Radiographs were obtained for documentation purposes.   After attaining the desired flow of contrast documented above, a 0.5 to 1.0 ml test dose of 0.25% Marcaine was injected into each  respective transforaminal space.  The patient was observed for 90 seconds post injection.  After no sensory deficits were reported, and normal lower extremity motor function was noted,   the above injectate was administered so that equal amounts of the injectate were placed at each foramen (level) into the transforaminal epidural space.   Additional Comments:  The patient tolerated the  procedure well Dressing: 2 x 2 sterile gauze and Band-Aid    Post-procedure details: Patient was observed during the procedure. Post-procedure instructions were reviewed.  Patient left the clinic in stable condition.     Clinical History: MRI LUMBAR SPINE WITHOUT CONTRAST  TECHNIQUE: Multiplanar, multisequence MR imaging of the lumbar spine was performed. No intravenous contrast was administered.  COMPARISON: MRI lumbar spine 10/16/2014. Plain films lumbar spine 11/11/2018.  FINDINGS: Segmentation: Standard.  Alignment: There is mild convex left scoliosis. 0.3 cm anterolisthesis L4 on L5 due to facet arthropathy is new since the prior MRI.  Vertebrae: No fracture or worrisome lesion. A few small hemangiomas are noted. Small sclerotic lesion in L5 is unchanged and may be a bone island.  Conus medullaris and cauda equina: Conus extends to the L1-2 level. Conus and cauda equina appear normal.  Paraspinal and other soft tissues: Negative.  Disc levels:  T11-12 and T12-L1 are imaged in the sagittal plane only. Small disc protrusions at both levels without central canal or foraminal narrowing are unchanged.  L1-2: Shallow disc bulge and mild facet degenerative disease without stenosis, unchanged.  L2-3: Ligamentum flavum thickening, shallow broad-based disc bulge and mild-to-moderate facet arthropathy. There is moderate central canal stenosis. The foramina are open. Spondylosis has progressed since the prior exam.  L3-4: Spondylosis has progressed. There is moderate to moderately severe facet degenerative change, a diffuse broad-based disc bulge and ligamentum flavum thickening. Moderately severe to severe central canal and bilateral subarticular recess narrowing are seen. Mild to moderate foraminal narrowing is worse on the right.  L4-5: Right laminotomy defect is identified as on the prior examination. There is a new broad-based protrusion with  cephalad extension in the right subarticular recess and foramen. Disc impinges on both the exiting right L4 and descending right L5 roots. There is also marked left foraminal narrowing and impingement on the exiting left L4 root due to disc and facet arthropathy. Moderate central canal stenosis is present overall.  L5-S1: Nerve roots are peripherally adhesed in the thecal sac consistent with arachnoiditis, unchanged. Right laminectomy defect is identified. There is a broad-based protrusion with caudal extension encroaching on both descending S1 roots. Moderate to moderately severe foraminal narrowing is worse on the left. Spondylosis has progressed since the prior examination.  IMPRESSION: Worsened spondylosis at L4-5 where the patient is status post right laminotomy. New broad-based protrusion with cephalad extension in the right subarticular recess and foramen impinges on both the right L4 and L5 roots. There is also marked left foraminal narrowing with encroachment on the left L4 root.  Progressive spondylosis at L5-S1 where the patient is status post right laminotomy. A broad-based protrusion with caudal extension encroaches on both descending S1 roots. Findings consistent with arachnoiditis at this level are unchanged.  Moderately severe to severe central canal stenosis at L3-4 with narrowing in both subarticular recesses and impingement on the descending L4 roots is worse than on the prior examination. Mild to moderate foraminal narrowing at this level is worse on the right.  Moderate central canal stenosis at L2-3 is worse than on the prior MRI.   Electronically Signed  By:  Inge Rise M.D.  On: 11/27/2018 08:49     Objective:  VS:  HT:    WT:   BMI:     BP:(!) 187/83  HR:(!) 59bpm  TEMP: ( )  RESP:  Physical Exam  Ortho Exam Imaging: No results found.

## 2019-02-10 ENCOUNTER — Telehealth: Payer: Self-pay | Admitting: *Deleted

## 2019-02-11 NOTE — Telephone Encounter (Signed)
Ok for tramadol or tylenol 3.  Let me know

## 2019-02-11 NOTE — Telephone Encounter (Signed)
Tired calling patient no answer. Will try again later.

## 2019-02-18 ENCOUNTER — Telehealth: Payer: Self-pay | Admitting: Physical Medicine and Rehabilitation

## 2019-02-18 NOTE — Telephone Encounter (Signed)
Can you call the patient and schedule an OV with Dr. Lorin Mercy for his next available?

## 2019-02-18 NOTE — Telephone Encounter (Signed)
Patient wants to discuss options with her husband and she will call us back.

## 2019-02-18 NOTE — Telephone Encounter (Signed)
Options include left l3 tf esi with triazolam/valium, f/u Dr. Lorin Mercy, start Lyrica trial if not had - can do all three or any part

## 2019-03-03 ENCOUNTER — Other Ambulatory Visit: Payer: Self-pay

## 2019-03-03 ENCOUNTER — Encounter: Payer: Self-pay | Admitting: Orthopaedic Surgery

## 2019-03-03 ENCOUNTER — Ambulatory Visit: Payer: Medicare PPO | Admitting: Orthopaedic Surgery

## 2019-03-03 VITALS — BP 173/67 | HR 56 | Ht 65.0 in | Wt 136.0 lb

## 2019-03-03 DIAGNOSIS — Z9889 Other specified postprocedural states: Secondary | ICD-10-CM

## 2019-03-03 MED ORDER — OXYCODONE-ACETAMINOPHEN 5-325 MG PO TABS: 1.0000 | ORAL_TABLET | Freq: Three times a day (TID) | ORAL | 0 refills | Status: AC | PRN

## 2019-03-03 NOTE — Progress Notes (Signed)
Office Visit Note   Patient: Laura Romero           Date of Birth: [birth year]           MRN: [id number] Visit Date: 03/03/2019              Requested by: Nicoletta Dress, MD [address] [place],  [place] [address] PCP: Nicoletta Dress, MD   Assessment & Plan: Visit Diagnoses:  1. S/P lumbar discectomy     Plan: Patient has not gotten a lot of relief with epidurals.  We discussed with her repeat imaging of the lumbar spine be required.  We plan to recheck her back again in 8 weeks.  She has been taking the Percocet for several months and I discussed with her that some of her pain may be related to chronic narcotic medication she is taken although she has not been on a high dose.  We will have her stop the Percocet after the final prescription of 20tablets.  Recheck 8 weeks.  If she is having persistent symptoms we will consider repeat imaging studies.  Follow-Up Instructions: Return in about 8 weeks (around 04/28/2019).   Orders:  No orders of the defined types were placed in this encounter.  Meds ordered this encounter  Medications  . oxyCODONE-acetaminophen (PERCOCET/ROXICET) 5-325 MG tablet    Sig: Take 1 tablet by mouth every 8 (eight) hours as needed for severe pain.    Dispense:  20 tablet    Refill:  0      Procedures: No procedures performed   Clinical Data: No additional findings.   Subjective: Chief Complaint  Patient presents with  . Lower Back - Pain    HPI 79 year old female returns with ongoing problems with back pain.  She has been using oxycodone 5/325 and since 12/08/2018 she has had 120 tablets so far.  She had epidural 11 9 and also 02/05/2019 by Dr. Ernestina Patches and she states this gave her little relief.  Originally her pain was on the right side radiating to the buttocks and now is on the left side.  She states the left got worse after the last epidural.  She is amatory with a cane.  Today the pain seems to be going all the way  down her leg.  She states she took her last oxycodone today.  2016 patient had a left L4-5 microdiscectomy for recurrent HNP.  Review of Systems As detailed in HPI all other systems are negative.  Objective: Vital Signs: BP (!) 173/67   Pulse (!) 56   Ht 5\' 5"  (1.651 m)   Wt 136 lb (61.7 kg)   BMI 22.63 kg/m   Physical Exam Constitutional:      Appearance: She is well-developed.  HENT:     Head: Normocephalic.     Right Ear: External ear normal.     Left Ear: External ear normal.  Eyes:     Pupils: Pupils are equal, round, and reactive to light.  Neck:     Thyroid: No thyromegaly.     Trachea: No tracheal deviation.  Cardiovascular:     Rate and Rhythm: Normal rate.  Pulmonary:     Effort: Pulmonary effort is normal.  Abdominal:     Palpations: Abdomen is soft.  Skin:    General: Skin is warm and dry.  Neurological:     Mental Status: She is alert and oriented to person, place, and time.  Psychiatric:  Behavior: Behavior normal.     Ortho Exam patient has some sciatic notch tenderness well-healed lumbar incision.  Negative logroll the hips.  Knee and ankle jerk are intact.  Anterior tib is strong right and left.  Specialty Comments:  No specialty comments available.  Imaging: No results found.   PMFS History: Patient Active Problem List   Diagnosis Date Noted  . S/P lumbar discectomy 12/03/2014   Past Medical History:  Diagnosis Date  . Esophageal stricture   . GERD (gastroesophageal reflux disease)   . History of left bundle branch block (LBBB)   . Hypertension   . Status post dilatation of esophageal stricture     No family history on file.  Past Surgical History:  Procedure Laterality Date  . [place]   lower back surgery  . BREAST LUMPECTOMY Left   . CATARACT EXTRACTION Bilateral   . CHOLECYSTECTOMY    . ESOPHAGEAL DILATION    . LUMBAR LAMINECTOMY/DECOMPRESSION MICRODISCECTOMY Left 12/03/2014   Procedure: Left Re-exploration  L4-5 Lateral Recess Decompression, Microdiscectomy;  Surgeon: Marybelle Killings, MD;  Location: [facility];  Service: Orthopedics;  Laterality: Left;   Social History   Occupational History  . Not on file  Tobacco Use  . Smoking status: Never Smoker  . Smokeless tobacco: Never Used  Substance and Sexual Activity  . Alcohol use: No  . Drug use: No  . Sexual activity: Not on file

## 2019-03-11 ENCOUNTER — Telehealth: Payer: Self-pay | Admitting: Orthopaedic Surgery

## 2019-03-11 NOTE — Telephone Encounter (Signed)
Please advise 

## 2019-03-11 NOTE — Telephone Encounter (Signed)
Patient called. She would like a refill on the Oxycodone.

## 2019-03-12 NOTE — Telephone Encounter (Signed)
noted 

## 2019-03-12 NOTE — Telephone Encounter (Signed)
I called. Denied time to get off narcotics

## 2019-03-19 DIAGNOSIS — M48061 Spinal stenosis, lumbar region without neurogenic claudication: Secondary | ICD-10-CM | POA: Diagnosis not present

## 2019-03-19 DIAGNOSIS — M5416 Radiculopathy, lumbar region: Secondary | ICD-10-CM | POA: Diagnosis not present

## 2019-03-19 DIAGNOSIS — M4316 Spondylolisthesis, lumbar region: Secondary | ICD-10-CM | POA: Diagnosis not present

## 2019-03-26 DIAGNOSIS — M5416 Radiculopathy, lumbar region: Secondary | ICD-10-CM | POA: Diagnosis not present

## 2019-03-26 DIAGNOSIS — M48061 Spinal stenosis, lumbar region without neurogenic claudication: Secondary | ICD-10-CM | POA: Diagnosis not present

## 2019-03-26 DIAGNOSIS — F1120 Opioid dependence, uncomplicated: Secondary | ICD-10-CM | POA: Diagnosis not present

## 2019-03-26 DIAGNOSIS — M4316 Spondylolisthesis, lumbar region: Secondary | ICD-10-CM | POA: Diagnosis not present

## 2019-03-30 DIAGNOSIS — M5416 Radiculopathy, lumbar region: Secondary | ICD-10-CM | POA: Diagnosis not present

## 2019-03-30 DIAGNOSIS — M48061 Spinal stenosis, lumbar region without neurogenic claudication: Secondary | ICD-10-CM | POA: Diagnosis not present

## 2019-04-06 DIAGNOSIS — M47816 Spondylosis without myelopathy or radiculopathy, lumbar region: Secondary | ICD-10-CM | POA: Diagnosis not present

## 2019-04-06 DIAGNOSIS — M47817 Spondylosis without myelopathy or radiculopathy, lumbosacral region: Secondary | ICD-10-CM | POA: Diagnosis not present

## 2019-04-06 DIAGNOSIS — M5136 Other intervertebral disc degeneration, lumbar region: Secondary | ICD-10-CM | POA: Diagnosis not present

## 2019-04-06 DIAGNOSIS — M5137 Other intervertebral disc degeneration, lumbosacral region: Secondary | ICD-10-CM | POA: Diagnosis not present

## 2019-04-06 DIAGNOSIS — M48061 Spinal stenosis, lumbar region without neurogenic claudication: Secondary | ICD-10-CM | POA: Diagnosis not present

## 2019-04-06 DIAGNOSIS — M4316 Spondylolisthesis, lumbar region: Secondary | ICD-10-CM | POA: Diagnosis not present

## 2019-04-06 DIAGNOSIS — M5416 Radiculopathy, lumbar region: Secondary | ICD-10-CM | POA: Diagnosis not present

## 2019-04-07 DIAGNOSIS — R9431 Abnormal electrocardiogram [ECG] [EKG]: Secondary | ICD-10-CM | POA: Diagnosis not present

## 2019-04-07 DIAGNOSIS — Z01810 Encounter for preprocedural cardiovascular examination: Secondary | ICD-10-CM | POA: Diagnosis not present

## 2019-04-07 DIAGNOSIS — M48061 Spinal stenosis, lumbar region without neurogenic claudication: Secondary | ICD-10-CM | POA: Diagnosis not present

## 2019-04-07 DIAGNOSIS — Z22321 Carrier or suspected carrier of Methicillin susceptible Staphylococcus aureus: Secondary | ICD-10-CM | POA: Diagnosis not present

## 2019-04-07 DIAGNOSIS — K219 Gastro-esophageal reflux disease without esophagitis: Secondary | ICD-10-CM | POA: Diagnosis not present

## 2019-04-07 DIAGNOSIS — M545 Low back pain: Secondary | ICD-10-CM | POA: Diagnosis not present

## 2019-04-07 DIAGNOSIS — Z01812 Encounter for preprocedural laboratory examination: Secondary | ICD-10-CM | POA: Diagnosis not present

## 2019-04-07 DIAGNOSIS — M5416 Radiculopathy, lumbar region: Secondary | ICD-10-CM | POA: Diagnosis not present

## 2019-04-07 DIAGNOSIS — M810 Age-related osteoporosis without current pathological fracture: Secondary | ICD-10-CM | POA: Diagnosis not present

## 2019-04-07 DIAGNOSIS — I10 Essential (primary) hypertension: Secondary | ICD-10-CM | POA: Diagnosis not present

## 2019-04-07 DIAGNOSIS — E871 Hypo-osmolality and hyponatremia: Secondary | ICD-10-CM | POA: Diagnosis not present

## 2019-04-07 DIAGNOSIS — Z20822 Contact with and (suspected) exposure to covid-19: Secondary | ICD-10-CM | POA: Diagnosis not present

## 2019-04-07 DIAGNOSIS — Z22322 Carrier or suspected carrier of Methicillin resistant Staphylococcus aureus: Secondary | ICD-10-CM | POA: Diagnosis not present

## 2019-04-07 DIAGNOSIS — Z01818 Encounter for other preprocedural examination: Secondary | ICD-10-CM | POA: Diagnosis not present

## 2019-04-07 DIAGNOSIS — R011 Cardiac murmur, unspecified: Secondary | ICD-10-CM | POA: Diagnosis not present

## 2019-04-08 DIAGNOSIS — R011 Cardiac murmur, unspecified: Secondary | ICD-10-CM | POA: Diagnosis not present

## 2019-04-10 DIAGNOSIS — I10 Essential (primary) hypertension: Secondary | ICD-10-CM | POA: Diagnosis not present

## 2019-04-10 DIAGNOSIS — Z79899 Other long term (current) drug therapy: Secondary | ICD-10-CM | POA: Diagnosis not present

## 2019-04-10 DIAGNOSIS — M4316 Spondylolisthesis, lumbar region: Secondary | ICD-10-CM | POA: Diagnosis not present

## 2019-04-10 DIAGNOSIS — K219 Gastro-esophageal reflux disease without esophagitis: Secondary | ICD-10-CM | POA: Diagnosis not present

## 2019-04-10 DIAGNOSIS — M5416 Radiculopathy, lumbar region: Secondary | ICD-10-CM | POA: Diagnosis not present

## 2019-04-10 DIAGNOSIS — M48061 Spinal stenosis, lumbar region without neurogenic claudication: Secondary | ICD-10-CM | POA: Diagnosis not present

## 2019-04-11 DIAGNOSIS — Z79899 Other long term (current) drug therapy: Secondary | ICD-10-CM | POA: Diagnosis not present

## 2019-04-11 DIAGNOSIS — M5416 Radiculopathy, lumbar region: Secondary | ICD-10-CM | POA: Diagnosis not present

## 2019-04-11 DIAGNOSIS — I10 Essential (primary) hypertension: Secondary | ICD-10-CM | POA: Diagnosis not present

## 2019-04-11 DIAGNOSIS — M48061 Spinal stenosis, lumbar region without neurogenic claudication: Secondary | ICD-10-CM | POA: Diagnosis not present

## 2019-04-11 DIAGNOSIS — K219 Gastro-esophageal reflux disease without esophagitis: Secondary | ICD-10-CM | POA: Diagnosis not present

## 2019-04-28 ENCOUNTER — Ambulatory Visit: Payer: Medicare PPO | Admitting: Orthopaedic Surgery

## 2019-04-30 DIAGNOSIS — R7301 Impaired fasting glucose: Secondary | ICD-10-CM | POA: Diagnosis not present

## 2019-04-30 DIAGNOSIS — K449 Diaphragmatic hernia without obstruction or gangrene: Secondary | ICD-10-CM | POA: Diagnosis not present

## 2019-04-30 DIAGNOSIS — K219 Gastro-esophageal reflux disease without esophagitis: Secondary | ICD-10-CM | POA: Diagnosis not present

## 2019-04-30 DIAGNOSIS — M8589 Other specified disorders of bone density and structure, multiple sites: Secondary | ICD-10-CM | POA: Diagnosis not present

## 2019-04-30 DIAGNOSIS — Z139 Encounter for screening, unspecified: Secondary | ICD-10-CM | POA: Diagnosis not present

## 2019-04-30 DIAGNOSIS — I10 Essential (primary) hypertension: Secondary | ICD-10-CM | POA: Diagnosis not present

## 2019-04-30 DIAGNOSIS — E785 Hyperlipidemia, unspecified: Secondary | ICD-10-CM | POA: Diagnosis not present

## 2019-05-11 DIAGNOSIS — M48061 Spinal stenosis, lumbar region without neurogenic claudication: Secondary | ICD-10-CM | POA: Diagnosis not present

## 2019-05-11 DIAGNOSIS — I10 Essential (primary) hypertension: Secondary | ICD-10-CM | POA: Diagnosis not present

## 2019-05-14 DIAGNOSIS — M48061 Spinal stenosis, lumbar region without neurogenic claudication: Secondary | ICD-10-CM | POA: Diagnosis not present

## 2019-05-14 DIAGNOSIS — G2581 Restless legs syndrome: Secondary | ICD-10-CM | POA: Diagnosis not present

## 2019-05-14 DIAGNOSIS — I10 Essential (primary) hypertension: Secondary | ICD-10-CM | POA: Diagnosis not present

## 2019-05-21 DIAGNOSIS — I10 Essential (primary) hypertension: Secondary | ICD-10-CM | POA: Diagnosis not present

## 2019-06-25 DIAGNOSIS — I10 Essential (primary) hypertension: Secondary | ICD-10-CM | POA: Diagnosis not present

## 2019-06-25 DIAGNOSIS — R600 Localized edema: Secondary | ICD-10-CM | POA: Diagnosis not present

## 2019-07-09 DIAGNOSIS — R600 Localized edema: Secondary | ICD-10-CM | POA: Diagnosis not present

## 2019-07-09 DIAGNOSIS — R7301 Impaired fasting glucose: Secondary | ICD-10-CM | POA: Diagnosis not present

## 2019-07-09 DIAGNOSIS — M8589 Other specified disorders of bone density and structure, multiple sites: Secondary | ICD-10-CM | POA: Diagnosis not present

## 2019-07-09 DIAGNOSIS — K449 Diaphragmatic hernia without obstruction or gangrene: Secondary | ICD-10-CM | POA: Diagnosis not present

## 2019-07-09 DIAGNOSIS — I10 Essential (primary) hypertension: Secondary | ICD-10-CM | POA: Diagnosis not present

## 2019-07-09 DIAGNOSIS — E785 Hyperlipidemia, unspecified: Secondary | ICD-10-CM | POA: Diagnosis not present

## 2019-07-09 DIAGNOSIS — Z6821 Body mass index (BMI) 21.0-21.9, adult: Secondary | ICD-10-CM | POA: Diagnosis not present

## 2019-07-09 DIAGNOSIS — K219 Gastro-esophageal reflux disease without esophagitis: Secondary | ICD-10-CM | POA: Diagnosis not present

## 2019-07-27 DIAGNOSIS — M79605 Pain in left leg: Secondary | ICD-10-CM | POA: Diagnosis not present

## 2019-07-27 DIAGNOSIS — R600 Localized edema: Secondary | ICD-10-CM | POA: Diagnosis not present

## 2019-07-27 DIAGNOSIS — I10 Essential (primary) hypertension: Secondary | ICD-10-CM | POA: Diagnosis not present

## 2019-08-20 DIAGNOSIS — I10 Essential (primary) hypertension: Secondary | ICD-10-CM | POA: Diagnosis not present

## 2019-08-20 DIAGNOSIS — K449 Diaphragmatic hernia without obstruction or gangrene: Secondary | ICD-10-CM | POA: Diagnosis not present

## 2019-08-20 DIAGNOSIS — R7301 Impaired fasting glucose: Secondary | ICD-10-CM | POA: Diagnosis not present

## 2019-08-20 DIAGNOSIS — M8589 Other specified disorders of bone density and structure, multiple sites: Secondary | ICD-10-CM | POA: Diagnosis not present

## 2019-08-20 DIAGNOSIS — K219 Gastro-esophageal reflux disease without esophagitis: Secondary | ICD-10-CM | POA: Diagnosis not present

## 2019-08-20 DIAGNOSIS — R600 Localized edema: Secondary | ICD-10-CM | POA: Diagnosis not present

## 2019-08-20 DIAGNOSIS — E785 Hyperlipidemia, unspecified: Secondary | ICD-10-CM | POA: Diagnosis not present

## 2019-09-28 DIAGNOSIS — Z01419 Encounter for gynecological examination (general) (routine) without abnormal findings: Secondary | ICD-10-CM | POA: Diagnosis not present

## 2019-10-09 DIAGNOSIS — Z1231 Encounter for screening mammogram for malignant neoplasm of breast: Secondary | ICD-10-CM | POA: Diagnosis not present

## 2019-10-20 DIAGNOSIS — Z0000 Encounter for general adult medical examination without abnormal findings: Secondary | ICD-10-CM | POA: Diagnosis not present

## 2019-10-20 DIAGNOSIS — E785 Hyperlipidemia, unspecified: Secondary | ICD-10-CM | POA: Diagnosis not present

## 2019-10-20 DIAGNOSIS — Z9181 History of falling: Secondary | ICD-10-CM | POA: Diagnosis not present

## 2019-10-20 DIAGNOSIS — Z1331 Encounter for screening for depression: Secondary | ICD-10-CM | POA: Diagnosis not present

## 2019-11-02 DIAGNOSIS — M8589 Other specified disorders of bone density and structure, multiple sites: Secondary | ICD-10-CM | POA: Diagnosis not present

## 2019-11-02 DIAGNOSIS — I10 Essential (primary) hypertension: Secondary | ICD-10-CM | POA: Diagnosis not present

## 2019-11-02 DIAGNOSIS — R600 Localized edema: Secondary | ICD-10-CM | POA: Diagnosis not present

## 2019-11-02 DIAGNOSIS — E785 Hyperlipidemia, unspecified: Secondary | ICD-10-CM | POA: Diagnosis not present

## 2019-11-02 DIAGNOSIS — R7301 Impaired fasting glucose: Secondary | ICD-10-CM | POA: Diagnosis not present

## 2019-11-02 DIAGNOSIS — K449 Diaphragmatic hernia without obstruction or gangrene: Secondary | ICD-10-CM | POA: Diagnosis not present

## 2019-11-02 DIAGNOSIS — K219 Gastro-esophageal reflux disease without esophagitis: Secondary | ICD-10-CM | POA: Diagnosis not present

## 2019-12-08 DIAGNOSIS — R300 Dysuria: Secondary | ICD-10-CM | POA: Diagnosis not present

## 2019-12-08 DIAGNOSIS — R1031 Right lower quadrant pain: Secondary | ICD-10-CM | POA: Diagnosis not present

## 2019-12-08 DIAGNOSIS — N3091 Cystitis, unspecified with hematuria: Secondary | ICD-10-CM | POA: Diagnosis not present

## 2019-12-12 DIAGNOSIS — R109 Unspecified abdominal pain: Secondary | ICD-10-CM | POA: Diagnosis not present

## 2019-12-12 DIAGNOSIS — R590 Localized enlarged lymph nodes: Secondary | ICD-10-CM | POA: Diagnosis not present

## 2019-12-12 DIAGNOSIS — B9689 Other specified bacterial agents as the cause of diseases classified elsewhere: Secondary | ICD-10-CM | POA: Diagnosis not present

## 2019-12-12 DIAGNOSIS — N3000 Acute cystitis without hematuria: Secondary | ICD-10-CM | POA: Diagnosis not present

## 2020-02-29 DIAGNOSIS — Z9841 Cataract extraction status, right eye: Secondary | ICD-10-CM | POA: Diagnosis not present

## 2020-02-29 DIAGNOSIS — I10 Essential (primary) hypertension: Secondary | ICD-10-CM | POA: Diagnosis not present

## 2020-02-29 DIAGNOSIS — H52223 Regular astigmatism, bilateral: Secondary | ICD-10-CM | POA: Diagnosis not present

## 2020-02-29 DIAGNOSIS — Z9842 Cataract extraction status, left eye: Secondary | ICD-10-CM | POA: Diagnosis not present

## 2020-02-29 DIAGNOSIS — H35371 Puckering of macula, right eye: Secondary | ICD-10-CM | POA: Diagnosis not present

## 2020-02-29 DIAGNOSIS — Z961 Presence of intraocular lens: Secondary | ICD-10-CM | POA: Diagnosis not present

## 2020-02-29 DIAGNOSIS — H35373 Puckering of macula, bilateral: Secondary | ICD-10-CM | POA: Diagnosis not present

## 2020-02-29 DIAGNOSIS — H35341 Macular cyst, hole, or pseudohole, right eye: Secondary | ICD-10-CM | POA: Diagnosis not present

## 2020-02-29 DIAGNOSIS — H5203 Hypermetropia, bilateral: Secondary | ICD-10-CM | POA: Diagnosis not present

## 2020-03-09 DIAGNOSIS — Z23 Encounter for immunization: Secondary | ICD-10-CM | POA: Diagnosis not present

## 2020-05-02 DIAGNOSIS — M8589 Other specified disorders of bone density and structure, multiple sites: Secondary | ICD-10-CM | POA: Diagnosis not present

## 2020-05-02 DIAGNOSIS — E785 Hyperlipidemia, unspecified: Secondary | ICD-10-CM | POA: Diagnosis not present

## 2020-05-02 DIAGNOSIS — K449 Diaphragmatic hernia without obstruction or gangrene: Secondary | ICD-10-CM | POA: Diagnosis not present

## 2020-05-02 DIAGNOSIS — R7301 Impaired fasting glucose: Secondary | ICD-10-CM | POA: Diagnosis not present

## 2020-05-02 DIAGNOSIS — K219 Gastro-esophageal reflux disease without esophagitis: Secondary | ICD-10-CM | POA: Diagnosis not present

## 2020-05-02 DIAGNOSIS — I10 Essential (primary) hypertension: Secondary | ICD-10-CM | POA: Diagnosis not present

## 2020-07-20 DIAGNOSIS — L209 Atopic dermatitis, unspecified: Secondary | ICD-10-CM | POA: Diagnosis not present

## 2020-07-29 DIAGNOSIS — L2089 Other atopic dermatitis: Secondary | ICD-10-CM | POA: Diagnosis not present

## 2020-10-25 DIAGNOSIS — E785 Hyperlipidemia, unspecified: Secondary | ICD-10-CM | POA: Diagnosis not present

## 2020-10-25 DIAGNOSIS — Z9181 History of falling: Secondary | ICD-10-CM | POA: Diagnosis not present

## 2020-10-25 DIAGNOSIS — Z1331 Encounter for screening for depression: Secondary | ICD-10-CM | POA: Diagnosis not present

## 2020-10-25 DIAGNOSIS — Z139 Encounter for screening, unspecified: Secondary | ICD-10-CM | POA: Diagnosis not present

## 2020-10-25 DIAGNOSIS — Z0000 Encounter for general adult medical examination without abnormal findings: Secondary | ICD-10-CM | POA: Diagnosis not present

## 2020-10-26 DIAGNOSIS — Z01419 Encounter for gynecological examination (general) (routine) without abnormal findings: Secondary | ICD-10-CM | POA: Diagnosis not present

## 2020-10-26 DIAGNOSIS — R351 Nocturia: Secondary | ICD-10-CM | POA: Diagnosis not present

## 2020-10-31 DIAGNOSIS — N952 Postmenopausal atrophic vaginitis: Secondary | ICD-10-CM | POA: Diagnosis not present

## 2020-10-31 DIAGNOSIS — Z79899 Other long term (current) drug therapy: Secondary | ICD-10-CM | POA: Diagnosis not present

## 2020-10-31 DIAGNOSIS — N3946 Mixed incontinence: Secondary | ICD-10-CM | POA: Diagnosis not present

## 2020-10-31 DIAGNOSIS — N390 Urinary tract infection, site not specified: Secondary | ICD-10-CM | POA: Diagnosis not present

## 2020-10-31 DIAGNOSIS — R351 Nocturia: Secondary | ICD-10-CM | POA: Diagnosis not present

## 2020-11-07 DIAGNOSIS — I10 Essential (primary) hypertension: Secondary | ICD-10-CM | POA: Diagnosis not present

## 2020-11-07 DIAGNOSIS — R7301 Impaired fasting glucose: Secondary | ICD-10-CM | POA: Diagnosis not present

## 2020-11-07 DIAGNOSIS — E785 Hyperlipidemia, unspecified: Secondary | ICD-10-CM | POA: Diagnosis not present

## 2020-11-07 DIAGNOSIS — M8589 Other specified disorders of bone density and structure, multiple sites: Secondary | ICD-10-CM | POA: Diagnosis not present

## 2020-11-07 DIAGNOSIS — K219 Gastro-esophageal reflux disease without esophagitis: Secondary | ICD-10-CM | POA: Diagnosis not present

## 2020-11-07 DIAGNOSIS — Z23 Encounter for immunization: Secondary | ICD-10-CM | POA: Diagnosis not present

## 2020-11-07 DIAGNOSIS — K449 Diaphragmatic hernia without obstruction or gangrene: Secondary | ICD-10-CM | POA: Diagnosis not present

## 2020-11-16 DIAGNOSIS — R208 Other disturbances of skin sensation: Secondary | ICD-10-CM | POA: Diagnosis not present

## 2020-11-26 DIAGNOSIS — S81812A Laceration without foreign body, left lower leg, initial encounter: Secondary | ICD-10-CM | POA: Diagnosis not present

## 2020-11-30 DIAGNOSIS — K449 Diaphragmatic hernia without obstruction or gangrene: Secondary | ICD-10-CM | POA: Diagnosis not present

## 2020-11-30 DIAGNOSIS — Z23 Encounter for immunization: Secondary | ICD-10-CM | POA: Diagnosis not present

## 2020-11-30 DIAGNOSIS — K219 Gastro-esophageal reflux disease without esophagitis: Secondary | ICD-10-CM | POA: Diagnosis not present

## 2020-11-30 DIAGNOSIS — S81802A Unspecified open wound, left lower leg, initial encounter: Secondary | ICD-10-CM | POA: Diagnosis not present

## 2020-11-30 DIAGNOSIS — T148XXA Other injury of unspecified body region, initial encounter: Secondary | ICD-10-CM | POA: Diagnosis not present

## 2020-11-30 DIAGNOSIS — D649 Anemia, unspecified: Secondary | ICD-10-CM | POA: Diagnosis not present

## 2020-11-30 DIAGNOSIS — I10 Essential (primary) hypertension: Secondary | ICD-10-CM | POA: Diagnosis not present

## 2020-12-05 DIAGNOSIS — N952 Postmenopausal atrophic vaginitis: Secondary | ICD-10-CM | POA: Diagnosis not present

## 2020-12-05 DIAGNOSIS — R351 Nocturia: Secondary | ICD-10-CM | POA: Diagnosis not present

## 2020-12-05 DIAGNOSIS — N3281 Overactive bladder: Secondary | ICD-10-CM | POA: Diagnosis not present

## 2020-12-05 DIAGNOSIS — N390 Urinary tract infection, site not specified: Secondary | ICD-10-CM | POA: Diagnosis not present

## 2020-12-21 DIAGNOSIS — Z1231 Encounter for screening mammogram for malignant neoplasm of breast: Secondary | ICD-10-CM | POA: Diagnosis not present

## 2020-12-21 DIAGNOSIS — N959 Unspecified menopausal and perimenopausal disorder: Secondary | ICD-10-CM | POA: Diagnosis not present

## 2020-12-21 DIAGNOSIS — M8580 Other specified disorders of bone density and structure, unspecified site: Secondary | ICD-10-CM | POA: Diagnosis not present

## 2020-12-21 DIAGNOSIS — M85852 Other specified disorders of bone density and structure, left thigh: Secondary | ICD-10-CM | POA: Diagnosis not present

## 2021-03-08 DIAGNOSIS — I213 ST elevation (STEMI) myocardial infarction of unspecified site: Secondary | ICD-10-CM | POA: Diagnosis not present

## 2021-03-08 DIAGNOSIS — Z9049 Acquired absence of other specified parts of digestive tract: Secondary | ICD-10-CM | POA: Diagnosis not present

## 2021-03-08 DIAGNOSIS — I10 Essential (primary) hypertension: Secondary | ICD-10-CM | POA: Diagnosis not present

## 2021-03-08 DIAGNOSIS — R1084 Generalized abdominal pain: Secondary | ICD-10-CM | POA: Diagnosis not present

## 2021-03-08 DIAGNOSIS — I447 Left bundle-branch block, unspecified: Secondary | ICD-10-CM | POA: Diagnosis not present

## 2021-03-08 DIAGNOSIS — K449 Diaphragmatic hernia without obstruction or gangrene: Secondary | ICD-10-CM | POA: Diagnosis not present

## 2021-03-08 DIAGNOSIS — R1013 Epigastric pain: Secondary | ICD-10-CM | POA: Diagnosis not present

## 2021-03-08 DIAGNOSIS — R079 Chest pain, unspecified: Secondary | ICD-10-CM | POA: Diagnosis not present

## 2021-03-08 DIAGNOSIS — J9811 Atelectasis: Secondary | ICD-10-CM | POA: Diagnosis not present

## 2021-05-08 DIAGNOSIS — R7301 Impaired fasting glucose: Secondary | ICD-10-CM | POA: Diagnosis not present

## 2021-05-08 DIAGNOSIS — I10 Essential (primary) hypertension: Secondary | ICD-10-CM | POA: Diagnosis not present

## 2021-05-08 DIAGNOSIS — E785 Hyperlipidemia, unspecified: Secondary | ICD-10-CM | POA: Diagnosis not present

## 2021-05-08 DIAGNOSIS — D649 Anemia, unspecified: Secondary | ICD-10-CM | POA: Diagnosis not present

## 2021-05-08 DIAGNOSIS — E538 Deficiency of other specified B group vitamins: Secondary | ICD-10-CM | POA: Diagnosis not present

## 2021-05-08 DIAGNOSIS — M8589 Other specified disorders of bone density and structure, multiple sites: Secondary | ICD-10-CM | POA: Diagnosis not present

## 2021-05-08 DIAGNOSIS — K219 Gastro-esophageal reflux disease without esophagitis: Secondary | ICD-10-CM | POA: Diagnosis not present

## 2021-05-08 DIAGNOSIS — K449 Diaphragmatic hernia without obstruction or gangrene: Secondary | ICD-10-CM | POA: Diagnosis not present

## 2021-05-09 DIAGNOSIS — E538 Deficiency of other specified B group vitamins: Secondary | ICD-10-CM | POA: Diagnosis not present

## 2021-05-11 DIAGNOSIS — I10 Essential (primary) hypertension: Secondary | ICD-10-CM | POA: Diagnosis not present

## 2021-05-11 DIAGNOSIS — H5203 Hypermetropia, bilateral: Secondary | ICD-10-CM | POA: Diagnosis not present

## 2021-05-11 DIAGNOSIS — H35341 Macular cyst, hole, or pseudohole, right eye: Secondary | ICD-10-CM | POA: Diagnosis not present

## 2021-05-11 DIAGNOSIS — H524 Presbyopia: Secondary | ICD-10-CM | POA: Diagnosis not present

## 2021-05-11 DIAGNOSIS — Z961 Presence of intraocular lens: Secondary | ICD-10-CM | POA: Diagnosis not present

## 2021-05-11 DIAGNOSIS — H52223 Regular astigmatism, bilateral: Secondary | ICD-10-CM | POA: Diagnosis not present

## 2021-05-11 DIAGNOSIS — H35371 Puckering of macula, right eye: Secondary | ICD-10-CM | POA: Diagnosis not present

## 2021-05-11 DIAGNOSIS — Z9849 Cataract extraction status, unspecified eye: Secondary | ICD-10-CM | POA: Diagnosis not present

## 2021-05-16 DIAGNOSIS — E538 Deficiency of other specified B group vitamins: Secondary | ICD-10-CM | POA: Diagnosis not present

## 2021-05-23 DIAGNOSIS — E538 Deficiency of other specified B group vitamins: Secondary | ICD-10-CM | POA: Diagnosis not present

## 2021-05-30 DIAGNOSIS — E538 Deficiency of other specified B group vitamins: Secondary | ICD-10-CM | POA: Diagnosis not present

## 2021-06-30 DIAGNOSIS — E538 Deficiency of other specified B group vitamins: Secondary | ICD-10-CM | POA: Diagnosis not present

## 2021-07-31 DIAGNOSIS — M545 Low back pain, unspecified: Secondary | ICD-10-CM | POA: Diagnosis not present

## 2021-07-31 DIAGNOSIS — E538 Deficiency of other specified B group vitamins: Secondary | ICD-10-CM | POA: Diagnosis not present

## 2021-08-18 DIAGNOSIS — M545 Low back pain, unspecified: Secondary | ICD-10-CM | POA: Diagnosis not present

## 2021-08-21 DIAGNOSIS — M545 Low back pain, unspecified: Secondary | ICD-10-CM | POA: Diagnosis not present

## 2021-08-25 DIAGNOSIS — M545 Low back pain, unspecified: Secondary | ICD-10-CM | POA: Diagnosis not present

## 2021-08-29 DIAGNOSIS — M545 Low back pain, unspecified: Secondary | ICD-10-CM | POA: Diagnosis not present

## 2021-08-31 DIAGNOSIS — E538 Deficiency of other specified B group vitamins: Secondary | ICD-10-CM | POA: Diagnosis not present

## 2021-08-31 DIAGNOSIS — M545 Low back pain, unspecified: Secondary | ICD-10-CM | POA: Diagnosis not present

## 2021-09-05 DIAGNOSIS — M545 Low back pain, unspecified: Secondary | ICD-10-CM | POA: Diagnosis not present

## 2021-09-07 DIAGNOSIS — M545 Low back pain, unspecified: Secondary | ICD-10-CM | POA: Diagnosis not present

## 2021-09-13 DIAGNOSIS — M545 Low back pain, unspecified: Secondary | ICD-10-CM | POA: Diagnosis not present

## 2021-09-19 DIAGNOSIS — M545 Low back pain, unspecified: Secondary | ICD-10-CM | POA: Diagnosis not present

## 2021-09-26 DIAGNOSIS — M545 Low back pain, unspecified: Secondary | ICD-10-CM | POA: Diagnosis not present

## 2021-10-03 DIAGNOSIS — E538 Deficiency of other specified B group vitamins: Secondary | ICD-10-CM | POA: Diagnosis not present

## 2021-10-05 DIAGNOSIS — M48062 Spinal stenosis, lumbar region with neurogenic claudication: Secondary | ICD-10-CM | POA: Diagnosis not present

## 2021-10-05 DIAGNOSIS — M4316 Spondylolisthesis, lumbar region: Secondary | ICD-10-CM | POA: Diagnosis not present

## 2021-10-12 DIAGNOSIS — M5126 Other intervertebral disc displacement, lumbar region: Secondary | ICD-10-CM | POA: Diagnosis not present

## 2021-10-12 DIAGNOSIS — M4316 Spondylolisthesis, lumbar region: Secondary | ICD-10-CM | POA: Diagnosis not present

## 2021-10-12 DIAGNOSIS — M48062 Spinal stenosis, lumbar region with neurogenic claudication: Secondary | ICD-10-CM | POA: Diagnosis not present

## 2021-10-17 DIAGNOSIS — S32000A Wedge compression fracture of unspecified lumbar vertebra, initial encounter for closed fracture: Secondary | ICD-10-CM | POA: Diagnosis not present

## 2021-10-18 DIAGNOSIS — N182 Chronic kidney disease, stage 2 (mild): Secondary | ICD-10-CM | POA: Diagnosis not present

## 2021-10-18 DIAGNOSIS — M5416 Radiculopathy, lumbar region: Secondary | ICD-10-CM | POA: Diagnosis not present

## 2021-10-18 DIAGNOSIS — S32020A Wedge compression fracture of second lumbar vertebra, initial encounter for closed fracture: Secondary | ICD-10-CM | POA: Diagnosis not present

## 2021-10-18 DIAGNOSIS — I10 Essential (primary) hypertension: Secondary | ICD-10-CM | POA: Diagnosis not present

## 2021-10-18 DIAGNOSIS — M48061 Spinal stenosis, lumbar region without neurogenic claudication: Secondary | ICD-10-CM | POA: Diagnosis not present

## 2021-10-18 DIAGNOSIS — K219 Gastro-esophageal reflux disease without esophagitis: Secondary | ICD-10-CM | POA: Diagnosis not present

## 2021-10-18 DIAGNOSIS — S32030A Wedge compression fracture of third lumbar vertebra, initial encounter for closed fracture: Secondary | ICD-10-CM | POA: Diagnosis not present

## 2021-10-18 DIAGNOSIS — M8080XA Other osteoporosis with current pathological fracture, unspecified site, initial encounter for fracture: Secondary | ICD-10-CM | POA: Diagnosis not present

## 2021-10-18 DIAGNOSIS — M5136 Other intervertebral disc degeneration, lumbar region: Secondary | ICD-10-CM | POA: Diagnosis not present

## 2021-10-18 DIAGNOSIS — N3946 Mixed incontinence: Secondary | ICD-10-CM | POA: Diagnosis not present

## 2021-10-18 DIAGNOSIS — I129 Hypertensive chronic kidney disease with stage 1 through stage 4 chronic kidney disease, or unspecified chronic kidney disease: Secondary | ICD-10-CM | POA: Diagnosis not present

## 2021-10-18 DIAGNOSIS — G2581 Restless legs syndrome: Secondary | ICD-10-CM | POA: Diagnosis not present

## 2021-11-07 DIAGNOSIS — E538 Deficiency of other specified B group vitamins: Secondary | ICD-10-CM | POA: Diagnosis not present

## 2021-11-13 DIAGNOSIS — R7301 Impaired fasting glucose: Secondary | ICD-10-CM | POA: Diagnosis not present

## 2021-11-13 DIAGNOSIS — E785 Hyperlipidemia, unspecified: Secondary | ICD-10-CM | POA: Diagnosis not present

## 2021-11-13 DIAGNOSIS — K219 Gastro-esophageal reflux disease without esophagitis: Secondary | ICD-10-CM | POA: Diagnosis not present

## 2021-11-13 DIAGNOSIS — M8589 Other specified disorders of bone density and structure, multiple sites: Secondary | ICD-10-CM | POA: Diagnosis not present

## 2021-11-13 DIAGNOSIS — D649 Anemia, unspecified: Secondary | ICD-10-CM | POA: Diagnosis not present

## 2021-11-13 DIAGNOSIS — K449 Diaphragmatic hernia without obstruction or gangrene: Secondary | ICD-10-CM | POA: Diagnosis not present

## 2021-11-13 DIAGNOSIS — Z23 Encounter for immunization: Secondary | ICD-10-CM | POA: Diagnosis not present

## 2021-11-13 DIAGNOSIS — I10 Essential (primary) hypertension: Secondary | ICD-10-CM | POA: Diagnosis not present

## 2021-11-13 DIAGNOSIS — D519 Vitamin B12 deficiency anemia, unspecified: Secondary | ICD-10-CM | POA: Diagnosis not present

## 2021-12-12 DIAGNOSIS — S32000A Wedge compression fracture of unspecified lumbar vertebra, initial encounter for closed fracture: Secondary | ICD-10-CM | POA: Diagnosis not present

## 2021-12-12 DIAGNOSIS — D519 Vitamin B12 deficiency anemia, unspecified: Secondary | ICD-10-CM | POA: Diagnosis not present

## 2022-01-16 DIAGNOSIS — E538 Deficiency of other specified B group vitamins: Secondary | ICD-10-CM | POA: Diagnosis not present

## 2022-02-08 DIAGNOSIS — S32000A Wedge compression fracture of unspecified lumbar vertebra, initial encounter for closed fracture: Secondary | ICD-10-CM | POA: Diagnosis not present

## 2022-02-08 DIAGNOSIS — M1611 Unilateral primary osteoarthritis, right hip: Secondary | ICD-10-CM | POA: Diagnosis not present

## 2022-02-08 DIAGNOSIS — M25551 Pain in right hip: Secondary | ICD-10-CM | POA: Diagnosis not present

## 2022-02-15 DIAGNOSIS — D519 Vitamin B12 deficiency anemia, unspecified: Secondary | ICD-10-CM | POA: Diagnosis not present

## 2022-03-01 DIAGNOSIS — M1611 Unilateral primary osteoarthritis, right hip: Secondary | ICD-10-CM | POA: Diagnosis not present

## 2022-03-01 DIAGNOSIS — M25551 Pain in right hip: Secondary | ICD-10-CM | POA: Diagnosis not present

## 2022-03-19 DIAGNOSIS — M79642 Pain in left hand: Secondary | ICD-10-CM | POA: Diagnosis not present

## 2022-03-19 DIAGNOSIS — R102 Pelvic and perineal pain: Secondary | ICD-10-CM | POA: Diagnosis not present

## 2022-03-19 DIAGNOSIS — R2232 Localized swelling, mass and lump, left upper limb: Secondary | ICD-10-CM | POA: Diagnosis not present

## 2022-03-19 DIAGNOSIS — M79645 Pain in left finger(s): Secondary | ICD-10-CM | POA: Diagnosis not present

## 2022-03-20 DIAGNOSIS — D519 Vitamin B12 deficiency anemia, unspecified: Secondary | ICD-10-CM | POA: Diagnosis not present

## 2022-04-11 DIAGNOSIS — M7062 Trochanteric bursitis, left hip: Secondary | ICD-10-CM | POA: Diagnosis not present

## 2022-04-19 DIAGNOSIS — E538 Deficiency of other specified B group vitamins: Secondary | ICD-10-CM | POA: Diagnosis not present

## 2022-05-14 DIAGNOSIS — R7301 Impaired fasting glucose: Secondary | ICD-10-CM | POA: Diagnosis not present

## 2022-05-14 DIAGNOSIS — Z9181 History of falling: Secondary | ICD-10-CM | POA: Diagnosis not present

## 2022-05-14 DIAGNOSIS — D519 Vitamin B12 deficiency anemia, unspecified: Secondary | ICD-10-CM | POA: Diagnosis not present

## 2022-05-14 DIAGNOSIS — Z139 Encounter for screening, unspecified: Secondary | ICD-10-CM | POA: Diagnosis not present

## 2022-05-14 DIAGNOSIS — I10 Essential (primary) hypertension: Secondary | ICD-10-CM | POA: Diagnosis not present

## 2022-05-14 DIAGNOSIS — E785 Hyperlipidemia, unspecified: Secondary | ICD-10-CM | POA: Diagnosis not present

## 2022-05-14 DIAGNOSIS — Z1331 Encounter for screening for depression: Secondary | ICD-10-CM | POA: Diagnosis not present

## 2022-05-14 DIAGNOSIS — K219 Gastro-esophageal reflux disease without esophagitis: Secondary | ICD-10-CM | POA: Diagnosis not present

## 2022-05-14 DIAGNOSIS — M8589 Other specified disorders of bone density and structure, multiple sites: Secondary | ICD-10-CM | POA: Diagnosis not present

## 2022-05-14 DIAGNOSIS — D649 Anemia, unspecified: Secondary | ICD-10-CM | POA: Diagnosis not present

## 2022-05-22 DIAGNOSIS — E538 Deficiency of other specified B group vitamins: Secondary | ICD-10-CM | POA: Diagnosis not present

## 2022-05-27 DIAGNOSIS — M25551 Pain in right hip: Secondary | ICD-10-CM | POA: Diagnosis not present

## 2022-05-27 DIAGNOSIS — S32009A Unspecified fracture of unspecified lumbar vertebra, initial encounter for closed fracture: Secondary | ICD-10-CM | POA: Diagnosis not present

## 2022-05-27 DIAGNOSIS — S2242XA Multiple fractures of ribs, left side, initial encounter for closed fracture: Secondary | ICD-10-CM | POA: Diagnosis not present

## 2022-05-27 DIAGNOSIS — M47816 Spondylosis without myelopathy or radiculopathy, lumbar region: Secondary | ICD-10-CM | POA: Diagnosis not present

## 2022-05-27 DIAGNOSIS — M545 Low back pain, unspecified: Secondary | ICD-10-CM | POA: Diagnosis not present

## 2022-05-27 DIAGNOSIS — M1611 Unilateral primary osteoarthritis, right hip: Secondary | ICD-10-CM | POA: Diagnosis not present

## 2022-05-31 ENCOUNTER — Telehealth: Payer: Self-pay

## 2022-05-31 DIAGNOSIS — S32009A Unspecified fracture of unspecified lumbar vertebra, initial encounter for closed fracture: Secondary | ICD-10-CM | POA: Diagnosis not present

## 2022-05-31 DIAGNOSIS — S2239XA Fracture of one rib, unspecified side, initial encounter for closed fracture: Secondary | ICD-10-CM | POA: Diagnosis not present

## 2022-05-31 NOTE — Telephone Encounter (Signed)
        Patient  visited [facility] on 05/27/2022  for treatment.   Telephone encounter attempt :  1st  A HIPAA compliant voice message was left requesting a return call.  Instructed patient to call back at [phone].   Duanna Runk [facility]  [facility] Population Health Community Resource Care Guide   [email or web address]  ?? [phone]   Website: [email or web address]  [email or web address]

## 2022-05-31 NOTE — Telephone Encounter (Signed)
     Patient  visit on 05/27/2022  at [facility] was for rib injury.  Have you been able to follow up with your primary care physician? Yes  The patient was or was not able to obtain any needed medicine or equipment.  Patient was able to obtain medication.  Are there diet recommendations that you are having difficulty following? No  Patient expresses understanding of discharge instructions and education provided has no other needs at this time. Yes   Laura Romero [facility]  [facility] Population Health Community Resource Care Guide   [email or web address]  ?? [phone]   Website: [email or web address]  [email or web address]

## 2022-06-21 DIAGNOSIS — E538 Deficiency of other specified B group vitamins: Secondary | ICD-10-CM | POA: Diagnosis not present

## 2022-06-21 DIAGNOSIS — S32002A Unstable burst fracture of unspecified lumbar vertebra, initial encounter for closed fracture: Secondary | ICD-10-CM | POA: Diagnosis not present

## 2022-06-21 DIAGNOSIS — M4316 Spondylolisthesis, lumbar region: Secondary | ICD-10-CM | POA: Diagnosis not present

## 2022-06-21 DIAGNOSIS — M7062 Trochanteric bursitis, left hip: Secondary | ICD-10-CM | POA: Diagnosis not present

## 2022-07-05 DIAGNOSIS — M5416 Radiculopathy, lumbar region: Secondary | ICD-10-CM | POA: Diagnosis not present

## 2022-07-05 DIAGNOSIS — M5116 Intervertebral disc disorders with radiculopathy, lumbar region: Secondary | ICD-10-CM | POA: Diagnosis not present

## 2022-07-05 DIAGNOSIS — G8929 Other chronic pain: Secondary | ICD-10-CM | POA: Diagnosis not present

## 2022-07-05 DIAGNOSIS — M48061 Spinal stenosis, lumbar region without neurogenic claudication: Secondary | ICD-10-CM | POA: Diagnosis not present

## 2022-07-05 DIAGNOSIS — M47817 Spondylosis without myelopathy or radiculopathy, lumbosacral region: Secondary | ICD-10-CM | POA: Diagnosis not present

## 2022-07-19 DIAGNOSIS — M5416 Radiculopathy, lumbar region: Secondary | ICD-10-CM | POA: Diagnosis not present

## 2022-07-25 DIAGNOSIS — E538 Deficiency of other specified B group vitamins: Secondary | ICD-10-CM | POA: Diagnosis not present

## 2022-07-30 DIAGNOSIS — M47817 Spondylosis without myelopathy or radiculopathy, lumbosacral region: Secondary | ICD-10-CM | POA: Diagnosis not present

## 2022-07-30 DIAGNOSIS — M533 Sacrococcygeal disorders, not elsewhere classified: Secondary | ICD-10-CM | POA: Diagnosis not present

## 2022-07-30 DIAGNOSIS — G8929 Other chronic pain: Secondary | ICD-10-CM | POA: Diagnosis not present

## 2022-08-09 DIAGNOSIS — M533 Sacrococcygeal disorders, not elsewhere classified: Secondary | ICD-10-CM | POA: Diagnosis not present

## 2022-08-20 DIAGNOSIS — M47816 Spondylosis without myelopathy or radiculopathy, lumbar region: Secondary | ICD-10-CM | POA: Diagnosis not present

## 2022-08-29 DIAGNOSIS — E538 Deficiency of other specified B group vitamins: Secondary | ICD-10-CM | POA: Diagnosis not present

## 2022-09-13 DIAGNOSIS — M47816 Spondylosis without myelopathy or radiculopathy, lumbar region: Secondary | ICD-10-CM | POA: Diagnosis not present

## 2022-10-03 DIAGNOSIS — E538 Deficiency of other specified B group vitamins: Secondary | ICD-10-CM | POA: Diagnosis not present

## 2022-10-05 DIAGNOSIS — L0100 Impetigo, unspecified: Secondary | ICD-10-CM | POA: Diagnosis not present

## 2022-10-05 DIAGNOSIS — L97921 Non-pressure chronic ulcer of unspecified part of left lower leg limited to breakdown of skin: Secondary | ICD-10-CM | POA: Diagnosis not present

## 2022-10-08 DIAGNOSIS — G8929 Other chronic pain: Secondary | ICD-10-CM | POA: Diagnosis not present

## 2022-10-08 DIAGNOSIS — M47817 Spondylosis without myelopathy or radiculopathy, lumbosacral region: Secondary | ICD-10-CM | POA: Diagnosis not present

## 2022-10-19 DIAGNOSIS — L97921 Non-pressure chronic ulcer of unspecified part of left lower leg limited to breakdown of skin: Secondary | ICD-10-CM | POA: Diagnosis not present

## 2022-10-19 DIAGNOSIS — L0100 Impetigo, unspecified: Secondary | ICD-10-CM | POA: Diagnosis not present

## 2022-11-05 DIAGNOSIS — M47816 Spondylosis without myelopathy or radiculopathy, lumbar region: Secondary | ICD-10-CM | POA: Diagnosis not present

## 2022-11-07 DIAGNOSIS — E538 Deficiency of other specified B group vitamins: Secondary | ICD-10-CM | POA: Diagnosis not present

## 2022-11-09 DIAGNOSIS — L97921 Non-pressure chronic ulcer of unspecified part of left lower leg limited to breakdown of skin: Secondary | ICD-10-CM | POA: Diagnosis not present

## 2022-11-14 DIAGNOSIS — E785 Hyperlipidemia, unspecified: Secondary | ICD-10-CM | POA: Diagnosis not present

## 2022-11-14 DIAGNOSIS — R7301 Impaired fasting glucose: Secondary | ICD-10-CM | POA: Diagnosis not present

## 2022-11-14 DIAGNOSIS — K219 Gastro-esophageal reflux disease without esophagitis: Secondary | ICD-10-CM | POA: Diagnosis not present

## 2022-11-14 DIAGNOSIS — I10 Essential (primary) hypertension: Secondary | ICD-10-CM | POA: Diagnosis not present

## 2022-11-14 DIAGNOSIS — M8589 Other specified disorders of bone density and structure, multiple sites: Secondary | ICD-10-CM | POA: Diagnosis not present

## 2022-11-14 DIAGNOSIS — D649 Anemia, unspecified: Secondary | ICD-10-CM | POA: Diagnosis not present

## 2022-11-14 DIAGNOSIS — K449 Diaphragmatic hernia without obstruction or gangrene: Secondary | ICD-10-CM | POA: Diagnosis not present

## 2022-11-14 DIAGNOSIS — D519 Vitamin B12 deficiency anemia, unspecified: Secondary | ICD-10-CM | POA: Diagnosis not present

## 2022-12-06 DIAGNOSIS — G8929 Other chronic pain: Secondary | ICD-10-CM | POA: Diagnosis not present

## 2022-12-06 DIAGNOSIS — M5416 Radiculopathy, lumbar region: Secondary | ICD-10-CM | POA: Diagnosis not present

## 2022-12-12 DIAGNOSIS — D519 Vitamin B12 deficiency anemia, unspecified: Secondary | ICD-10-CM | POA: Diagnosis not present

## 2022-12-24 DIAGNOSIS — M5417 Radiculopathy, lumbosacral region: Secondary | ICD-10-CM | POA: Diagnosis not present

## 2023-01-16 DIAGNOSIS — D519 Vitamin B12 deficiency anemia, unspecified: Secondary | ICD-10-CM | POA: Diagnosis not present

## 2023-01-22 DIAGNOSIS — M5416 Radiculopathy, lumbar region: Secondary | ICD-10-CM | POA: Diagnosis not present

## 2023-01-22 DIAGNOSIS — G8929 Other chronic pain: Secondary | ICD-10-CM | POA: Diagnosis not present

## 2023-01-22 DIAGNOSIS — M545 Low back pain, unspecified: Secondary | ICD-10-CM | POA: Diagnosis not present

## 2023-02-21 DIAGNOSIS — E538 Deficiency of other specified B group vitamins: Secondary | ICD-10-CM | POA: Diagnosis not present

## 2023-03-20 DIAGNOSIS — Z961 Presence of intraocular lens: Secondary | ICD-10-CM | POA: Diagnosis not present

## 2023-03-20 DIAGNOSIS — Z9841 Cataract extraction status, right eye: Secondary | ICD-10-CM | POA: Diagnosis not present

## 2023-03-20 DIAGNOSIS — H35371 Puckering of macula, right eye: Secondary | ICD-10-CM | POA: Diagnosis not present

## 2023-03-20 DIAGNOSIS — Z9842 Cataract extraction status, left eye: Secondary | ICD-10-CM | POA: Diagnosis not present

## 2023-03-20 DIAGNOSIS — H5203 Hypermetropia, bilateral: Secondary | ICD-10-CM | POA: Diagnosis not present

## 2023-03-20 DIAGNOSIS — H52223 Regular astigmatism, bilateral: Secondary | ICD-10-CM | POA: Diagnosis not present

## 2023-03-20 DIAGNOSIS — H35341 Macular cyst, hole, or pseudohole, right eye: Secondary | ICD-10-CM | POA: Diagnosis not present

## 2023-03-20 DIAGNOSIS — H524 Presbyopia: Secondary | ICD-10-CM | POA: Diagnosis not present

## 2023-03-26 DIAGNOSIS — G8929 Other chronic pain: Secondary | ICD-10-CM | POA: Diagnosis not present

## 2023-03-26 DIAGNOSIS — M5416 Radiculopathy, lumbar region: Secondary | ICD-10-CM | POA: Diagnosis not present

## 2023-03-26 DIAGNOSIS — M545 Low back pain, unspecified: Secondary | ICD-10-CM | POA: Diagnosis not present

## 2023-03-27 DIAGNOSIS — M2569 Stiffness of other specified joint, not elsewhere classified: Secondary | ICD-10-CM | POA: Diagnosis not present

## 2023-03-27 DIAGNOSIS — E538 Deficiency of other specified B group vitamins: Secondary | ICD-10-CM | POA: Diagnosis not present

## 2023-03-27 DIAGNOSIS — R2681 Unsteadiness on feet: Secondary | ICD-10-CM | POA: Diagnosis not present

## 2023-03-27 DIAGNOSIS — M6281 Muscle weakness (generalized): Secondary | ICD-10-CM | POA: Diagnosis not present

## 2023-03-27 DIAGNOSIS — R293 Abnormal posture: Secondary | ICD-10-CM | POA: Diagnosis not present

## 2023-03-27 DIAGNOSIS — M545 Low back pain, unspecified: Secondary | ICD-10-CM | POA: Diagnosis not present

## 2023-04-01 DIAGNOSIS — M545 Low back pain, unspecified: Secondary | ICD-10-CM | POA: Diagnosis not present

## 2023-04-01 DIAGNOSIS — M2569 Stiffness of other specified joint, not elsewhere classified: Secondary | ICD-10-CM | POA: Diagnosis not present

## 2023-04-01 DIAGNOSIS — M6281 Muscle weakness (generalized): Secondary | ICD-10-CM | POA: Diagnosis not present

## 2023-04-01 DIAGNOSIS — R293 Abnormal posture: Secondary | ICD-10-CM | POA: Diagnosis not present

## 2023-04-01 DIAGNOSIS — R2681 Unsteadiness on feet: Secondary | ICD-10-CM | POA: Diagnosis not present

## 2023-04-03 DIAGNOSIS — R293 Abnormal posture: Secondary | ICD-10-CM | POA: Diagnosis not present

## 2023-04-03 DIAGNOSIS — R2681 Unsteadiness on feet: Secondary | ICD-10-CM | POA: Diagnosis not present

## 2023-04-03 DIAGNOSIS — M6281 Muscle weakness (generalized): Secondary | ICD-10-CM | POA: Diagnosis not present

## 2023-04-03 DIAGNOSIS — M545 Low back pain, unspecified: Secondary | ICD-10-CM | POA: Diagnosis not present

## 2023-04-03 DIAGNOSIS — M2569 Stiffness of other specified joint, not elsewhere classified: Secondary | ICD-10-CM | POA: Diagnosis not present

## 2023-04-08 DIAGNOSIS — M545 Low back pain, unspecified: Secondary | ICD-10-CM | POA: Diagnosis not present

## 2023-04-08 DIAGNOSIS — R293 Abnormal posture: Secondary | ICD-10-CM | POA: Diagnosis not present

## 2023-04-08 DIAGNOSIS — M6281 Muscle weakness (generalized): Secondary | ICD-10-CM | POA: Diagnosis not present

## 2023-04-08 DIAGNOSIS — R2681 Unsteadiness on feet: Secondary | ICD-10-CM | POA: Diagnosis not present

## 2023-04-08 DIAGNOSIS — M2569 Stiffness of other specified joint, not elsewhere classified: Secondary | ICD-10-CM | POA: Diagnosis not present

## 2023-04-15 DIAGNOSIS — R293 Abnormal posture: Secondary | ICD-10-CM | POA: Diagnosis not present

## 2023-04-15 DIAGNOSIS — R2681 Unsteadiness on feet: Secondary | ICD-10-CM | POA: Diagnosis not present

## 2023-04-15 DIAGNOSIS — M545 Low back pain, unspecified: Secondary | ICD-10-CM | POA: Diagnosis not present

## 2023-04-15 DIAGNOSIS — M6281 Muscle weakness (generalized): Secondary | ICD-10-CM | POA: Diagnosis not present

## 2023-04-15 DIAGNOSIS — M2569 Stiffness of other specified joint, not elsewhere classified: Secondary | ICD-10-CM | POA: Diagnosis not present

## 2023-04-17 DIAGNOSIS — M6281 Muscle weakness (generalized): Secondary | ICD-10-CM | POA: Diagnosis not present

## 2023-04-17 DIAGNOSIS — M545 Low back pain, unspecified: Secondary | ICD-10-CM | POA: Diagnosis not present

## 2023-04-17 DIAGNOSIS — R293 Abnormal posture: Secondary | ICD-10-CM | POA: Diagnosis not present

## 2023-04-17 DIAGNOSIS — M2569 Stiffness of other specified joint, not elsewhere classified: Secondary | ICD-10-CM | POA: Diagnosis not present

## 2023-04-17 DIAGNOSIS — R2681 Unsteadiness on feet: Secondary | ICD-10-CM | POA: Diagnosis not present

## 2023-04-24 DIAGNOSIS — M545 Low back pain, unspecified: Secondary | ICD-10-CM | POA: Diagnosis not present

## 2023-04-24 DIAGNOSIS — R2681 Unsteadiness on feet: Secondary | ICD-10-CM | POA: Diagnosis not present

## 2023-04-24 DIAGNOSIS — M6281 Muscle weakness (generalized): Secondary | ICD-10-CM | POA: Diagnosis not present

## 2023-04-24 DIAGNOSIS — R293 Abnormal posture: Secondary | ICD-10-CM | POA: Diagnosis not present

## 2023-04-24 DIAGNOSIS — M2569 Stiffness of other specified joint, not elsewhere classified: Secondary | ICD-10-CM | POA: Diagnosis not present

## 2023-04-30 DIAGNOSIS — M2569 Stiffness of other specified joint, not elsewhere classified: Secondary | ICD-10-CM | POA: Diagnosis not present

## 2023-04-30 DIAGNOSIS — M6281 Muscle weakness (generalized): Secondary | ICD-10-CM | POA: Diagnosis not present

## 2023-04-30 DIAGNOSIS — R293 Abnormal posture: Secondary | ICD-10-CM | POA: Diagnosis not present

## 2023-04-30 DIAGNOSIS — M545 Low back pain, unspecified: Secondary | ICD-10-CM | POA: Diagnosis not present

## 2023-04-30 DIAGNOSIS — R2681 Unsteadiness on feet: Secondary | ICD-10-CM | POA: Diagnosis not present

## 2023-05-01 DIAGNOSIS — E538 Deficiency of other specified B group vitamins: Secondary | ICD-10-CM | POA: Diagnosis not present

## 2023-05-08 DIAGNOSIS — R293 Abnormal posture: Secondary | ICD-10-CM | POA: Diagnosis not present

## 2023-05-08 DIAGNOSIS — M2569 Stiffness of other specified joint, not elsewhere classified: Secondary | ICD-10-CM | POA: Diagnosis not present

## 2023-05-08 DIAGNOSIS — M6281 Muscle weakness (generalized): Secondary | ICD-10-CM | POA: Diagnosis not present

## 2023-05-08 DIAGNOSIS — R2681 Unsteadiness on feet: Secondary | ICD-10-CM | POA: Diagnosis not present

## 2023-05-08 DIAGNOSIS — M545 Low back pain, unspecified: Secondary | ICD-10-CM | POA: Diagnosis not present

## 2023-05-21 DIAGNOSIS — G8929 Other chronic pain: Secondary | ICD-10-CM | POA: Diagnosis not present

## 2023-05-21 DIAGNOSIS — M5416 Radiculopathy, lumbar region: Secondary | ICD-10-CM | POA: Diagnosis not present

## 2023-05-21 DIAGNOSIS — M545 Low back pain, unspecified: Secondary | ICD-10-CM | POA: Diagnosis not present

## 2023-06-05 DIAGNOSIS — D519 Vitamin B12 deficiency anemia, unspecified: Secondary | ICD-10-CM | POA: Diagnosis not present

## 2023-06-17 DIAGNOSIS — I10 Essential (primary) hypertension: Secondary | ICD-10-CM | POA: Diagnosis not present

## 2023-06-17 DIAGNOSIS — D519 Vitamin B12 deficiency anemia, unspecified: Secondary | ICD-10-CM | POA: Diagnosis not present

## 2023-06-17 DIAGNOSIS — M8589 Other specified disorders of bone density and structure, multiple sites: Secondary | ICD-10-CM | POA: Diagnosis not present

## 2023-06-17 DIAGNOSIS — K219 Gastro-esophageal reflux disease without esophagitis: Secondary | ICD-10-CM | POA: Diagnosis not present

## 2023-06-17 DIAGNOSIS — K449 Diaphragmatic hernia without obstruction or gangrene: Secondary | ICD-10-CM | POA: Diagnosis not present

## 2023-06-17 DIAGNOSIS — E785 Hyperlipidemia, unspecified: Secondary | ICD-10-CM | POA: Diagnosis not present

## 2023-06-17 DIAGNOSIS — D649 Anemia, unspecified: Secondary | ICD-10-CM | POA: Diagnosis not present

## 2023-06-17 DIAGNOSIS — R7301 Impaired fasting glucose: Secondary | ICD-10-CM | POA: Diagnosis not present

## 2023-07-10 DIAGNOSIS — D519 Vitamin B12 deficiency anemia, unspecified: Secondary | ICD-10-CM | POA: Diagnosis not present

## 2023-08-14 DIAGNOSIS — D519 Vitamin B12 deficiency anemia, unspecified: Secondary | ICD-10-CM | POA: Diagnosis not present

## 2023-08-23 DIAGNOSIS — L237 Allergic contact dermatitis due to plants, except food: Secondary | ICD-10-CM | POA: Diagnosis not present

## 2023-08-23 DIAGNOSIS — R21 Rash and other nonspecific skin eruption: Secondary | ICD-10-CM | POA: Diagnosis not present

## 2023-09-18 DIAGNOSIS — D519 Vitamin B12 deficiency anemia, unspecified: Secondary | ICD-10-CM | POA: Diagnosis not present

## 2023-09-24 DIAGNOSIS — M25551 Pain in right hip: Secondary | ICD-10-CM | POA: Diagnosis not present

## 2023-09-24 DIAGNOSIS — G8929 Other chronic pain: Secondary | ICD-10-CM | POA: Diagnosis not present

## 2023-09-25 DIAGNOSIS — M47816 Spondylosis without myelopathy or radiculopathy, lumbar region: Secondary | ICD-10-CM | POA: Diagnosis not present

## 2023-09-25 DIAGNOSIS — M25551 Pain in right hip: Secondary | ICD-10-CM | POA: Diagnosis not present

## 2023-10-22 DIAGNOSIS — M545 Low back pain, unspecified: Secondary | ICD-10-CM | POA: Diagnosis not present

## 2023-10-22 DIAGNOSIS — G8929 Other chronic pain: Secondary | ICD-10-CM | POA: Diagnosis not present

## 2023-10-22 DIAGNOSIS — M47816 Spondylosis without myelopathy or radiculopathy, lumbar region: Secondary | ICD-10-CM | POA: Diagnosis not present

## 2023-10-23 DIAGNOSIS — D519 Vitamin B12 deficiency anemia, unspecified: Secondary | ICD-10-CM | POA: Diagnosis not present

## 2023-11-19 DIAGNOSIS — G8929 Other chronic pain: Secondary | ICD-10-CM | POA: Diagnosis not present

## 2023-11-19 DIAGNOSIS — M47816 Spondylosis without myelopathy or radiculopathy, lumbar region: Secondary | ICD-10-CM | POA: Diagnosis not present

## 2023-11-19 DIAGNOSIS — M545 Low back pain, unspecified: Secondary | ICD-10-CM | POA: Diagnosis not present

## 2023-11-20 DIAGNOSIS — D519 Vitamin B12 deficiency anemia, unspecified: Secondary | ICD-10-CM | POA: Diagnosis not present

## 2023-12-19 DIAGNOSIS — Z23 Encounter for immunization: Secondary | ICD-10-CM | POA: Diagnosis not present

## 2023-12-19 DIAGNOSIS — K449 Diaphragmatic hernia without obstruction or gangrene: Secondary | ICD-10-CM | POA: Diagnosis not present

## 2023-12-19 DIAGNOSIS — D519 Vitamin B12 deficiency anemia, unspecified: Secondary | ICD-10-CM | POA: Diagnosis not present

## 2023-12-19 DIAGNOSIS — E785 Hyperlipidemia, unspecified: Secondary | ICD-10-CM | POA: Diagnosis not present

## 2023-12-19 DIAGNOSIS — I10 Essential (primary) hypertension: Secondary | ICD-10-CM | POA: Diagnosis not present

## 2023-12-19 DIAGNOSIS — R7301 Impaired fasting glucose: Secondary | ICD-10-CM | POA: Diagnosis not present

## 2023-12-19 DIAGNOSIS — M8589 Other specified disorders of bone density and structure, multiple sites: Secondary | ICD-10-CM | POA: Diagnosis not present

## 2023-12-19 DIAGNOSIS — D649 Anemia, unspecified: Secondary | ICD-10-CM | POA: Diagnosis not present

## 2023-12-19 DIAGNOSIS — R634 Abnormal weight loss: Secondary | ICD-10-CM | POA: Diagnosis not present

## 2023-12-19 DIAGNOSIS — K219 Gastro-esophageal reflux disease without esophagitis: Secondary | ICD-10-CM | POA: Diagnosis not present

## 2023-12-25 DIAGNOSIS — D519 Vitamin B12 deficiency anemia, unspecified: Secondary | ICD-10-CM | POA: Diagnosis not present

## 2024-01-21 DIAGNOSIS — G8929 Other chronic pain: Secondary | ICD-10-CM | POA: Diagnosis not present

## 2024-01-21 DIAGNOSIS — M47816 Spondylosis without myelopathy or radiculopathy, lumbar region: Secondary | ICD-10-CM | POA: Diagnosis not present

## 2024-01-21 DIAGNOSIS — M545 Low back pain, unspecified: Secondary | ICD-10-CM | POA: Diagnosis not present

## 2024-01-29 DIAGNOSIS — D519 Vitamin B12 deficiency anemia, unspecified: Secondary | ICD-10-CM | POA: Diagnosis not present
